# Patient Record
Sex: Female | Born: 1945 | Race: White | Hispanic: No | Marital: Married | Smoking: Never smoker
Health system: Southern US, Community
[De-identification: ages and names within clinical notes are randomized; demographics above are authoritative.]

## PROBLEM LIST (undated history)

## (undated) DIAGNOSIS — H18519 Endothelial corneal dystrophy, unspecified eye: Secondary | ICD-10-CM

## (undated) DIAGNOSIS — K743 Primary biliary cirrhosis: Secondary | ICD-10-CM

## (undated) DIAGNOSIS — M358 Other specified systemic involvement of connective tissue: Secondary | ICD-10-CM

## (undated) DIAGNOSIS — K745 Biliary cirrhosis, unspecified: Secondary | ICD-10-CM

## (undated) DIAGNOSIS — H1851 Endothelial corneal dystrophy: Secondary | ICD-10-CM

## (undated) DIAGNOSIS — IMO0002 Reserved for concepts with insufficient information to code with codable children: Secondary | ICD-10-CM

## (undated) DIAGNOSIS — I7300 Raynaud's syndrome without gangrene: Secondary | ICD-10-CM

## (undated) DIAGNOSIS — Z9889 Other specified postprocedural states: Secondary | ICD-10-CM

## (undated) DIAGNOSIS — Z8719 Personal history of other diseases of the digestive system: Secondary | ICD-10-CM

## (undated) DIAGNOSIS — L940 Localized scleroderma [morphea]: Secondary | ICD-10-CM

## (undated) DIAGNOSIS — M810 Age-related osteoporosis without current pathological fracture: Secondary | ICD-10-CM

## (undated) HISTORY — PX: CHOLECYSTECTOMY: SHX55

## (undated) HISTORY — PX: TUBAL LIGATION: SHX77

## (undated) HISTORY — PX: RETINAL DETACHMENT SURGERY: SHX105

## (undated) HISTORY — DX: Localized scleroderma (morphea): L94.0

## (undated) HISTORY — DX: Reserved for concepts with insufficient information to code with codable children: IMO0002

## (undated) HISTORY — PX: REFRACTIVE SURGERY: SHX103

## (undated) HISTORY — PX: OTHER SURGICAL HISTORY: SHX169

## (undated) HISTORY — PX: CATARACT EXTRACTION: SUR2

## (undated) HISTORY — PX: CORNEAL TRANSPLANT: SHX108

## (undated) HISTORY — DX: Localized scleroderma (morphea): K74.3

## (undated) HISTORY — PX: KNEE SURGERY: SHX244

## (undated) HISTORY — DX: Endothelial corneal dystrophy: H18.51

## (undated) HISTORY — PX: APPENDECTOMY: SHX54

## (undated) HISTORY — DX: Age-related osteoporosis without current pathological fracture: M81.0

## (undated) HISTORY — DX: Endothelial corneal dystrophy, unspecified eye: H18.519

## (undated) HISTORY — DX: Biliary cirrhosis, unspecified: K74.5

## (undated) HISTORY — PX: TONSILLECTOMY: SUR1361

## (undated) HISTORY — PX: COLONOSCOPY: SHX174

## (undated) HISTORY — DX: Other specified systemic involvement of connective tissue: M35.8

---

## 1998-10-08 ENCOUNTER — Other Ambulatory Visit: Admission: RE | Admit: 1998-10-08 | Discharge: 1998-10-08 | Payer: Self-pay | Admitting: Gynecology

## 1999-07-29 ENCOUNTER — Encounter: Admission: RE | Admit: 1999-07-29 | Discharge: 1999-07-29 | Payer: Self-pay | Admitting: Gynecology

## 1999-07-29 ENCOUNTER — Encounter: Payer: Self-pay | Admitting: Gynecology

## 1999-10-22 ENCOUNTER — Other Ambulatory Visit: Admission: RE | Admit: 1999-10-22 | Discharge: 1999-10-22 | Payer: Self-pay | Admitting: Gynecology

## 1999-11-05 ENCOUNTER — Encounter: Admission: RE | Admit: 1999-11-05 | Discharge: 1999-11-05 | Payer: Self-pay | Admitting: Gynecology

## 1999-11-05 ENCOUNTER — Encounter: Payer: Self-pay | Admitting: Gynecology

## 2000-07-29 ENCOUNTER — Encounter: Payer: Self-pay | Admitting: Gynecology

## 2000-07-29 ENCOUNTER — Encounter: Admission: RE | Admit: 2000-07-29 | Discharge: 2000-07-29 | Payer: Self-pay | Admitting: Gynecology

## 2000-12-23 ENCOUNTER — Other Ambulatory Visit: Admission: RE | Admit: 2000-12-23 | Discharge: 2000-12-23 | Payer: Self-pay | Admitting: Gynecology

## 2001-07-31 ENCOUNTER — Encounter: Payer: Self-pay | Admitting: Gynecology

## 2001-07-31 ENCOUNTER — Encounter: Admission: RE | Admit: 2001-07-31 | Discharge: 2001-07-31 | Payer: Self-pay | Admitting: Gynecology

## 2001-12-27 ENCOUNTER — Other Ambulatory Visit: Admission: RE | Admit: 2001-12-27 | Discharge: 2001-12-27 | Payer: Self-pay | Admitting: Gynecology

## 2002-09-04 ENCOUNTER — Encounter: Admission: RE | Admit: 2002-09-04 | Discharge: 2002-09-04 | Payer: Self-pay | Admitting: Gynecology

## 2002-09-04 ENCOUNTER — Encounter: Payer: Self-pay | Admitting: Gynecology

## 2003-01-28 ENCOUNTER — Other Ambulatory Visit: Admission: RE | Admit: 2003-01-28 | Discharge: 2003-01-28 | Payer: Self-pay | Admitting: Gynecology

## 2003-07-15 ENCOUNTER — Encounter: Admission: RE | Admit: 2003-07-15 | Discharge: 2003-07-15 | Payer: Self-pay | Admitting: Internal Medicine

## 2003-09-20 ENCOUNTER — Encounter: Admission: RE | Admit: 2003-09-20 | Discharge: 2003-09-20 | Payer: Self-pay | Admitting: Gynecology

## 2004-01-02 ENCOUNTER — Encounter: Admission: RE | Admit: 2004-01-02 | Discharge: 2004-01-02 | Payer: Self-pay | Admitting: Internal Medicine

## 2004-01-20 ENCOUNTER — Ambulatory Visit (HOSPITAL_COMMUNITY): Admission: RE | Admit: 2004-01-20 | Discharge: 2004-01-20 | Payer: Self-pay | Admitting: Orthopaedic Surgery

## 2006-05-18 ENCOUNTER — Encounter: Admission: RE | Admit: 2006-05-18 | Discharge: 2006-05-18 | Payer: Self-pay | Admitting: Gynecology

## 2006-07-08 ENCOUNTER — Ambulatory Visit (HOSPITAL_COMMUNITY): Admission: RE | Admit: 2006-07-08 | Discharge: 2006-07-08 | Payer: Self-pay | Admitting: Internal Medicine

## 2006-07-20 ENCOUNTER — Ambulatory Visit (HOSPITAL_COMMUNITY): Admission: RE | Admit: 2006-07-20 | Discharge: 2006-07-20 | Payer: Self-pay | Admitting: *Deleted

## 2006-07-20 ENCOUNTER — Encounter (INDEPENDENT_AMBULATORY_CARE_PROVIDER_SITE_OTHER): Payer: Self-pay | Admitting: *Deleted

## 2007-09-05 ENCOUNTER — Encounter: Admission: RE | Admit: 2007-09-05 | Discharge: 2007-09-05 | Payer: Self-pay | Admitting: Gynecology

## 2011-09-09 ENCOUNTER — Other Ambulatory Visit: Payer: Self-pay | Admitting: Gynecology

## 2011-09-09 DIAGNOSIS — Z1231 Encounter for screening mammogram for malignant neoplasm of breast: Secondary | ICD-10-CM

## 2011-10-05 ENCOUNTER — Ambulatory Visit
Admission: RE | Admit: 2011-10-05 | Discharge: 2011-10-05 | Disposition: A | Discharge status: Home / Self Care | Payer: Medicare Other | Source: Ambulatory Visit | Attending: Gynecology | Admitting: Gynecology

## 2011-10-05 DIAGNOSIS — Z1231 Encounter for screening mammogram for malignant neoplasm of breast: Secondary | ICD-10-CM

## 2011-10-19 ENCOUNTER — Other Ambulatory Visit: Payer: Self-pay | Admitting: Gynecology

## 2011-12-23 ENCOUNTER — Other Ambulatory Visit: Payer: Self-pay | Admitting: Gastroenterology

## 2011-12-23 DIAGNOSIS — R634 Abnormal weight loss: Secondary | ICD-10-CM

## 2011-12-23 DIAGNOSIS — R109 Unspecified abdominal pain: Secondary | ICD-10-CM

## 2011-12-28 ENCOUNTER — Ambulatory Visit
Admission: RE | Admit: 2011-12-28 | Discharge: 2011-12-28 | Disposition: A | Discharge status: Home / Self Care | Payer: Medicare Other | Source: Ambulatory Visit | Attending: Gastroenterology | Admitting: Gastroenterology

## 2011-12-28 DIAGNOSIS — R109 Unspecified abdominal pain: Secondary | ICD-10-CM

## 2011-12-28 DIAGNOSIS — R634 Abnormal weight loss: Secondary | ICD-10-CM

## 2011-12-28 MED ORDER — IOHEXOL 300 MG/ML  SOLN
100.0000 mL | Freq: Once | INTRAMUSCULAR | Status: AC | PRN
  Administered 2011-12-28: 100 mL via INTRAVENOUS

## 2012-01-13 ENCOUNTER — Other Ambulatory Visit: Payer: Self-pay | Admitting: Gastroenterology

## 2012-08-22 ENCOUNTER — Other Ambulatory Visit: Payer: Self-pay | Admitting: Registered Nurse

## 2012-09-20 ENCOUNTER — Ambulatory Visit: Payer: Medicare Other | Admitting: Sports Medicine

## 2013-09-07 ENCOUNTER — Other Ambulatory Visit: Payer: Self-pay | Admitting: Dermatology

## 2014-03-13 ENCOUNTER — Other Ambulatory Visit: Payer: Self-pay

## 2014-03-13 DIAGNOSIS — Z1231 Encounter for screening mammogram for malignant neoplasm of breast: Secondary | ICD-10-CM

## 2014-04-02 ENCOUNTER — Ambulatory Visit
Admission: RE | Admit: 2014-04-02 | Discharge: 2014-04-02 | Disposition: A | Discharge status: Home / Self Care | Payer: Medicare Other | Source: Ambulatory Visit

## 2014-04-02 DIAGNOSIS — Z1231 Encounter for screening mammogram for malignant neoplasm of breast: Secondary | ICD-10-CM

## 2014-04-03 ENCOUNTER — Other Ambulatory Visit: Payer: Self-pay | Admitting: Internal Medicine

## 2014-04-03 DIAGNOSIS — R928 Other abnormal and inconclusive findings on diagnostic imaging of breast: Secondary | ICD-10-CM

## 2014-04-18 ENCOUNTER — Ambulatory Visit
Admission: RE | Admit: 2014-04-18 | Discharge: 2014-04-18 | Disposition: A | Discharge status: Home / Self Care | Payer: Medicare Other | Source: Ambulatory Visit | Attending: Internal Medicine | Admitting: Internal Medicine

## 2014-04-18 DIAGNOSIS — R928 Other abnormal and inconclusive findings on diagnostic imaging of breast: Secondary | ICD-10-CM

## 2014-10-24 ENCOUNTER — Other Ambulatory Visit: Payer: Self-pay | Admitting: Gastroenterology

## 2014-10-24 DIAGNOSIS — K743 Primary biliary cirrhosis: Secondary | ICD-10-CM

## 2014-10-31 ENCOUNTER — Ambulatory Visit
Admission: RE | Admit: 2014-10-31 | Discharge: 2014-10-31 | Disposition: A | Discharge status: Home / Self Care | Payer: PPO | Source: Ambulatory Visit | Attending: Gastroenterology | Admitting: Gastroenterology

## 2014-10-31 DIAGNOSIS — K743 Primary biliary cirrhosis: Secondary | ICD-10-CM

## 2015-07-09 DIAGNOSIS — Z961 Presence of intraocular lens: Secondary | ICD-10-CM | POA: Diagnosis not present

## 2015-07-09 DIAGNOSIS — H1851 Endothelial corneal dystrophy: Secondary | ICD-10-CM | POA: Diagnosis not present

## 2015-07-09 DIAGNOSIS — R51 Headache: Secondary | ICD-10-CM | POA: Diagnosis not present

## 2015-07-09 DIAGNOSIS — Z947 Corneal transplant status: Secondary | ICD-10-CM | POA: Diagnosis not present

## 2015-08-27 DIAGNOSIS — Z961 Presence of intraocular lens: Secondary | ICD-10-CM | POA: Diagnosis not present

## 2015-08-27 DIAGNOSIS — Z947 Corneal transplant status: Secondary | ICD-10-CM | POA: Diagnosis not present

## 2015-08-27 DIAGNOSIS — H1851 Endothelial corneal dystrophy: Secondary | ICD-10-CM | POA: Diagnosis not present

## 2015-08-27 DIAGNOSIS — H3509 Other intraretinal microvascular abnormalities: Secondary | ICD-10-CM | POA: Diagnosis not present

## 2015-09-25 DIAGNOSIS — IMO0002 Reserved for concepts with insufficient information to code with codable children: Secondary | ICD-10-CM

## 2015-09-25 HISTORY — DX: Reserved for concepts with insufficient information to code with codable children: IMO0002

## 2015-10-03 ENCOUNTER — Encounter: Payer: Self-pay | Admitting: Gynecology

## 2015-10-03 ENCOUNTER — Ambulatory Visit (INDEPENDENT_AMBULATORY_CARE_PROVIDER_SITE_OTHER): Payer: PPO | Admitting: Gynecology

## 2015-10-03 VITALS — BP 120/76 | Ht 64.0 in | Wt 133.0 lb

## 2015-10-03 DIAGNOSIS — N814 Uterovaginal prolapse, unspecified: Secondary | ICD-10-CM | POA: Diagnosis not present

## 2015-10-03 DIAGNOSIS — M810 Age-related osteoporosis without current pathological fracture: Secondary | ICD-10-CM

## 2015-10-03 DIAGNOSIS — N816 Rectocele: Secondary | ICD-10-CM | POA: Diagnosis not present

## 2015-10-03 DIAGNOSIS — N8111 Cystocele, midline: Secondary | ICD-10-CM | POA: Diagnosis not present

## 2015-10-03 DIAGNOSIS — Z01419 Encounter for gynecological examination (general) (routine) without abnormal findings: Secondary | ICD-10-CM | POA: Diagnosis not present

## 2015-10-03 DIAGNOSIS — N952 Postmenopausal atrophic vaginitis: Secondary | ICD-10-CM | POA: Diagnosis not present

## 2015-10-03 DIAGNOSIS — R87610 Atypical squamous cells of undetermined significance on cytologic smear of cervix (ASC-US): Secondary | ICD-10-CM | POA: Diagnosis not present

## 2015-10-03 NOTE — Addendum Note (Signed)
Addended by: Nelva Nay on: 10/03/2015 03:47 PM   Modules accepted: Orders

## 2015-10-03 NOTE — Patient Instructions (Signed)
Office will call you to arrange urology appointment.  Talk to Dr. Shelia Media in reference to starting Prolia  You may obtain a copy of any labs that were done today by logging onto MyChart as outlined in the instructions provided with your AVS (after visit summary). The office will not call with normal lab results but certainly if there are any significant abnormalities then we will contact you.   Health Maintenance Adopting a healthy lifestyle and getting preventive care can go a long way to promote health and wellness. Talk with your health care provider about what schedule of regular examinations is right for you. This is a good chance for you to check in with your provider about disease prevention and staying healthy. In between checkups, there are plenty of things you can do on your own. Experts have done a lot of research about which lifestyle changes and preventive measures are most likely to keep you healthy. Ask your health care provider for more information. WEIGHT AND DIET  Eat a healthy diet  Be sure to include plenty of vegetables, fruits, low-fat dairy products, and lean protein.  Do not eat a lot of foods high in solid fats, added sugars, or salt.  Get regular exercise. This is one of the most important things you can do for your health.  Most adults should exercise for at least 150 minutes each week. The exercise should increase your heart rate and make you sweat (moderate-intensity exercise).  Most adults should also do strengthening exercises at least twice a week. This is in addition to the moderate-intensity exercise.  Maintain a healthy weight  Body mass index (BMI) is a measurement that can be used to identify possible weight problems. It estimates body fat based on height and weight. Your health care provider can help determine your BMI and help you achieve or maintain a healthy weight.  For females 40 years of age and older:   A BMI below 18.5 is considered  underweight.  A BMI of 18.5 to 24.9 is normal.  A BMI of 25 to 29.9 is considered overweight.  A BMI of 30 and above is considered obese.  Watch levels of cholesterol and blood lipids  You should start having your blood tested for lipids and cholesterol at 69 years of age, then have this test every 5 years.  You may need to have your cholesterol levels checked more often if:  Your lipid or cholesterol levels are high.  You are older than 69 years of age.  You are at high risk for heart disease.  CANCER SCREENING   Lung Cancer  Lung cancer screening is recommended for adults 48-32 years old who are at high risk for lung cancer because of a history of smoking.  A yearly low-dose CT scan of the lungs is recommended for people who:  Currently smoke.  Have quit within the past 15 years.  Have at least a 30-pack-year history of smoking. A pack year is smoking an average of one pack of cigarettes a day for 1 year.  Yearly screening should continue until it has been 15 years since you quit.  Yearly screening should stop if you develop a health problem that would prevent you from having lung cancer treatment.  Breast Cancer  Practice breast self-awareness. This means understanding how your breasts normally appear and feel.  It also means doing regular breast self-exams. Let your health care provider know about any changes, no matter how small.  If you are in  your 20s or 30s, you should have a clinical breast exam (CBE) by a health care provider every 1-3 years as part of a regular health exam.  If you are 39 or older, have a CBE every year. Also consider having a breast X-ray (mammogram) every year.  If you have a family history of breast cancer, talk to your health care provider about genetic screening.  If you are at high risk for breast cancer, talk to your health care provider about having an MRI and a mammogram every year.  Breast cancer gene (BRCA) assessment is  recommended for women who have family members with BRCA-related cancers. BRCA-related cancers include:  Breast.  Ovarian.  Tubal.  Peritoneal cancers.  Results of the assessment will determine the need for genetic counseling and BRCA1 and BRCA2 testing. Cervical Cancer Routine pelvic examinations to screen for cervical cancer are no longer recommended for nonpregnant women who are considered low risk for cancer of the pelvic organs (ovaries, uterus, and vagina) and who do not have symptoms. A pelvic examination may be necessary if you have symptoms including those associated with pelvic infections. Ask your health care provider if a screening pelvic exam is right for you.   The Pap test is the screening test for cervical cancer for women who are considered at risk.  If you had a hysterectomy for a problem that was not cancer or a condition that could lead to cancer, then you no longer need Pap tests.  If you are older than 65 years, and you have had normal Pap tests for the past 10 years, you no longer need to have Pap tests.  If you have had past treatment for cervical cancer or a condition that could lead to cancer, you need Pap tests and screening for cancer for at least 20 years after your treatment.  If you no longer get a Pap test, assess your risk factors if they change (such as having a new sexual partner). This can affect whether you should start being screened again.  Some women have medical problems that increase their chance of getting cervical cancer. If this is the case for you, your health care provider may recommend more frequent screening and Pap tests.  The human papillomavirus (HPV) test is another test that may be used for cervical cancer screening. The HPV test looks for the virus that can cause cell changes in the cervix. The cells collected during the Pap test can be tested for HPV.  The HPV test can be used to screen women 60 years of age and older. Getting tested  for HPV can extend the interval between normal Pap tests from three to five years.  An HPV test also should be used to screen women of any age who have unclear Pap test results.  After 69 years of age, women should have HPV testing as often as Pap tests.  Colorectal Cancer  This type of cancer can be detected and often prevented.  Routine colorectal cancer screening usually begins at 69 years of age and continues through 69 years of age.  Your health care provider may recommend screening at an earlier age if you have risk factors for colon cancer.  Your health care provider may also recommend using home test kits to check for hidden blood in the stool.  A small camera at the end of a tube can be used to examine your colon directly (sigmoidoscopy or colonoscopy). This is done to check for the earliest forms of  colorectal cancer.  Routine screening usually begins at age 66.  Direct examination of the colon should be repeated every 5-10 years through 69 years of age. However, you may need to be screened more often if early forms of precancerous polyps or small growths are found. Skin Cancer  Check your skin from head to toe regularly.  Tell your health care provider about any new moles or changes in moles, especially if there is a change in a mole's shape or color.  Also tell your health care provider if you have a mole that is larger than the size of a pencil eraser.  Always use sunscreen. Apply sunscreen liberally and repeatedly throughout the day.  Protect yourself by wearing long sleeves, pants, a wide-brimmed hat, and sunglasses whenever you are outside. HEART DISEASE, DIABETES, AND HIGH BLOOD PRESSURE   Have your blood pressure checked at least every 1-2 years. High blood pressure causes heart disease and increases the risk of stroke.  If you are between 55 years and 55 years old, ask your health care provider if you should take aspirin to prevent strokes.  Have regular  diabetes screenings. This involves taking a blood sample to check your fasting blood sugar level.  If you are at a normal weight and have a low risk for diabetes, have this test once every three years after 69 years of age.  If you are overweight and have a high risk for diabetes, consider being tested at a younger age or more often. PREVENTING INFECTION  Hepatitis B  If you have a higher risk for hepatitis B, you should be screened for this virus. You are considered at high risk for hepatitis B if:  You were born in a country where hepatitis B is common. Ask your health care provider which countries are considered high risk.  Your parents were born in a high-risk country, and you have not been immunized against hepatitis B (hepatitis B vaccine).  You have HIV or AIDS.  You use needles to inject street drugs.  You live with someone who has hepatitis B.  You have had sex with someone who has hepatitis B.  You get hemodialysis treatment.  You take certain medicines for conditions, including cancer, organ transplantation, and autoimmune conditions. Hepatitis C  Blood testing is recommended for:  Everyone born from [birth year] through 1964.  Anyone with known risk factors for hepatitis C. Sexually transmitted infections (STIs)  You should be screened for sexually transmitted infections (STIs) including gonorrhea and chlamydia if:  You are sexually active and are younger than 69 years of age.  You are older than 69 years of age and your health care provider tells you that you are at risk for this type of infection.  Your sexual activity has changed since you were last screened and you are at an increased risk for chlamydia or gonorrhea. Ask your health care provider if you are at risk.  If you do not have HIV, but are at risk, it may be recommended that you take a prescription medicine daily to prevent HIV infection. This is called pre-exposure prophylaxis (PrEP). You are considered at  risk if:  You are sexually active and do not regularly use condoms or know the HIV status of your partner(s).  You take drugs by injection.  You are sexually active with a partner who has HIV. Talk with your health care provider about whether you are at high risk of being infected with HIV. If you choose to begin PrEP,  you should first be tested for HIV. You should then be tested every 3 months for as long as you are taking PrEP.  PREGNANCY   If you are premenopausal and you may become pregnant, ask your health care provider about preconception counseling.  If you may become pregnant, take 400 to 800 micrograms (mcg) of folic acid every day.  If you want to prevent pregnancy, talk to your health care provider about birth control (contraception). OSTEOPOROSIS AND MENOPAUSE   Osteoporosis is a disease in which the bones lose minerals and strength with aging. This can result in serious bone fractures. Your risk for osteoporosis can be identified using a bone density scan.  If you are 79 years of age or older, or if you are at risk for osteoporosis and fractures, ask your health care provider if you should be screened.  Ask your health care provider whether you should take a calcium or vitamin D supplement to lower your risk for osteoporosis.  Menopause may have certain physical symptoms and risks.  Hormone replacement therapy may reduce some of these symptoms and risks. Talk to your health care provider about whether hormone replacement therapy is right for you.  HOME CARE INSTRUCTIONS   Schedule regular health, dental, and eye exams.  Stay current with your immunizations.   Do not use any tobacco products including cigarettes, chewing tobacco, or electronic cigarettes.  If you are pregnant, do not drink alcohol.  If you are breastfeeding, limit how much and how often you drink alcohol.  Limit alcohol intake to no more than 1 drink per day for nonpregnant women. One drink equals  12 ounces of beer, 5 ounces of wine, or 1 ounces of hard liquor.  Do not use street drugs.  Do not share needles.  Ask your health care provider for help if you need support or information about quitting drugs.  Tell your health care provider if you often feel depressed.  Tell your health care provider if you have ever been abused or do not feel safe at home. Document Released: 10/26/2010 Document Revised: 08/27/2013 Document Reviewed: 03/14/2013 [facility] Patient Information 2015 [place], [place]. This information is not intended to replace advice given to you by your health care provider. Make sure you discuss any questions you have with your health care provider.

## 2015-10-03 NOTE — Progress Notes (Addendum)
Sandra Hobbs 22-Dec-1945 [id number]        70 y.o.  G2P2  for breast and pelvic exam.  Former patient of Dr. Ubaldo Glassing. Has not been seen in several years. Patient notes a bulging from her vagina over the past several years with straining. Is not having significant incontinence or pelvic discomfort.  Past medical history,surgical history, problem list, medications, allergies, family history and social history were all reviewed and documented as reviewed in the EPIC chart.  ROS:  Performed with pertinent positives and negatives included in the history, assessment and plan.   Additional significant findings :  None   Exam: [name] assistant Filed Vitals:   10/03/15 1429  BP: 120/76  Height: 5\' 4"  (1.626 m)  Weight: 133 lb (60.328 kg)   General appearance:  Normal affect, orientation and appearance. Skin: Grossly normal HEENT: Without gross lesions.  No cervical or supraclavicular adenopathy. Thyroid normal.  Lungs:  Clear without wheezing, rales or rhonchi Cardiac: RR, without RMG Abdominal:  Soft, nontender, without masses, guarding, rebound, organomegaly or hernia Breasts:  Examined lying and sitting without masses, retractions, discharge or axillary adenopathy. Pelvic:  Ext/BUS/vagina With second-degree cystocele and second-degree uterine prolapse. Mild rectocele. Generalized atrophic changes.  Cervix with atrophic changes. Pap smear done  Uterus anteverted, normal size, shape and contour, midline and mobile nontender   Adnexa without masses or tenderness    Anus and perineum normal   Rectovaginal normal sphincter tone without palpated masses or tenderness.    Assessment/Plan:  70 y.o. G2P2 female for breast and pelvic exam.   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 She also has significant uterine prolapse. A mild rectocele noted. I reviewed options with her to include observation, pessary and surgery. Is not having  significant incontinence historically. Surgery to include cystocele repair TVH BSO with or without rectocele repair was discussed. I'm going to refer her to urology for their evaluation and consideration of the cystocele repair as a combined procedure with myself performing the hysterectomy again with or without rectocele repair. Patient agrees with this. She's not sure she is ready to proceed with surgery at this point but would like at least to be evaluated. 2. Pap smear 2013. Pap smear done today. No history of significant abnormal Pap smears previously. 3. Mammography 2015. Patient acknowledges she is overdue and agrees to call and schedule. SBE monthly reviewed. 4. Colonoscopy 2015. Has appointment to see Dr. Sarina Ser being followed for biliary cirrhosis. 5. Osteoporosis. DEXA 2013 T score -3.9. This was done by Dr. Ubaldo Glassing. Apparently had transiently tried Fosamax/Boniva for several months but did not tolerate. Most recently had a discussion with Dr. Shelia Media as she had a bone density 2016 but I do not have a copy of this. She was to start Prolia and apparently had gone through the precertification process but never followed up for the injection. I reviewed with her the risks of fracture and my recommendations that she further discuss with Dr. Shelia Media and consider some treatment and she agrees to do so. 6. Health maintenance. No routine lab work done as she reports this done elsewhere. Patient will see the urologist and follow up with me if she is interested in pursuing surgery. At this point she is not interested in pessary.  Greater than 15 minutes of my time in excess of her breast and pelvic exam was spent in direct face to face counseling and coordination of care in regards to her  problems of pelvic relaxation and osteoporosis . [name] MD, 3:32 PM 10/03/2015

## 2015-10-06 ENCOUNTER — Telehealth: Payer: Self-pay | Admitting: *Deleted

## 2015-10-06 NOTE — Addendum Note (Signed)
Addended by: [name] on: 10/06/2015 02:03 PM   Modules accepted: Level of Service

## 2015-10-06 NOTE — Telephone Encounter (Signed)
Referral and notes faxed to [facility] they will fax me back with time and date to relay to pt.

## 2015-10-06 NOTE — Telephone Encounter (Signed)
-----   Message from [name], MD sent at 10/03/2015  3:40 PM EDT ----- Arrange appointment with Dr. Matilde Sprang at [facility] urology reference patient with cystocele/uterine prolapse/rectocele. Considering hysterectomy. Evaluate for cystocele repair as combined procedure

## 2015-10-08 LAB — PAP IG W/ RFLX HPV ASCU

## 2015-10-09 LAB — HUMAN PAPILLOMAVIRUS, HIGH RISK: HPV DNA High Risk: NOT DETECTED

## 2015-10-10 ENCOUNTER — Encounter: Payer: Self-pay | Admitting: Gynecology

## 2015-10-10 NOTE — Telephone Encounter (Signed)
Appointment on 11/21/15 @ 1:30pm with Dr.MacDiarmid pt informed

## 2015-11-20 ENCOUNTER — Other Ambulatory Visit: Payer: Self-pay | Admitting: Gastroenterology

## 2015-11-20 DIAGNOSIS — E039 Hypothyroidism, unspecified: Secondary | ICD-10-CM | POA: Diagnosis not present

## 2015-11-20 DIAGNOSIS — A09 Infectious gastroenteritis and colitis, unspecified: Secondary | ICD-10-CM | POA: Diagnosis not present

## 2015-11-20 DIAGNOSIS — K743 Primary biliary cirrhosis: Secondary | ICD-10-CM

## 2015-11-20 DIAGNOSIS — M810 Age-related osteoporosis without current pathological fracture: Secondary | ICD-10-CM | POA: Diagnosis not present

## 2015-11-21 DIAGNOSIS — R350 Frequency of micturition: Secondary | ICD-10-CM | POA: Diagnosis not present

## 2015-11-21 DIAGNOSIS — N8111 Cystocele, midline: Secondary | ICD-10-CM | POA: Diagnosis not present

## 2015-11-21 DIAGNOSIS — N3941 Urge incontinence: Secondary | ICD-10-CM | POA: Diagnosis not present

## 2015-11-28 ENCOUNTER — Ambulatory Visit
Admission: RE | Admit: 2015-11-28 | Discharge: 2015-11-28 | Disposition: A | Discharge status: Home / Self Care | Payer: PPO | Source: Ambulatory Visit | Attending: Gastroenterology | Admitting: Gastroenterology

## 2015-11-28 DIAGNOSIS — K743 Primary biliary cirrhosis: Secondary | ICD-10-CM

## 2015-11-28 DIAGNOSIS — K7460 Unspecified cirrhosis of liver: Secondary | ICD-10-CM | POA: Diagnosis not present

## 2016-01-29 DIAGNOSIS — M810 Age-related osteoporosis without current pathological fracture: Secondary | ICD-10-CM | POA: Diagnosis not present

## 2016-01-29 DIAGNOSIS — Z0000 Encounter for general adult medical examination without abnormal findings: Secondary | ICD-10-CM | POA: Diagnosis not present

## 2016-01-29 DIAGNOSIS — E559 Vitamin D deficiency, unspecified: Secondary | ICD-10-CM | POA: Diagnosis not present

## 2016-01-29 DIAGNOSIS — N390 Urinary tract infection, site not specified: Secondary | ICD-10-CM | POA: Diagnosis not present

## 2016-02-04 DIAGNOSIS — Z0000 Encounter for general adult medical examination without abnormal findings: Secondary | ICD-10-CM | POA: Diagnosis not present

## 2016-02-04 DIAGNOSIS — N390 Urinary tract infection, site not specified: Secondary | ICD-10-CM | POA: Diagnosis not present

## 2016-02-04 DIAGNOSIS — Z23 Encounter for immunization: Secondary | ICD-10-CM | POA: Diagnosis not present

## 2016-02-04 DIAGNOSIS — K745 Biliary cirrhosis, unspecified: Secondary | ICD-10-CM | POA: Diagnosis not present

## 2016-02-05 ENCOUNTER — Encounter: Payer: Self-pay | Admitting: Gynecology

## 2016-02-06 NOTE — Telephone Encounter (Signed)
I spoke with her about seeing urology to correct the cystocele surgically at the time of vaginal hysterectomy.  If interested recommend urology consult ie Dr Bernerd Limbo

## 2016-02-08 NOTE — Telephone Encounter (Signed)
Hysterectomy alone unlikely to correct the bladder prolapse, but I will review a copy of his office visit and see what he recommended.  Please obtain a copy for me to review

## 2016-02-09 NOTE — Telephone Encounter (Signed)
Dr. Mikle Bosworth office note is in her chart. It is scanned in in [facility] practitioner under "office visit [facility]".

## 2016-02-09 NOTE — Telephone Encounter (Signed)
I spoke with Dr. MCDiarmid's office. She has not had the urodynamics. They spoke with her on 01/27/16 and it appears to be financial reasons and health why she has not scheduled. No appt currently scheduled.

## 2016-02-09 NOTE — Telephone Encounter (Signed)
Did she have the urodynamics as he had ordered?

## 2016-05-06 DIAGNOSIS — R300 Dysuria: Secondary | ICD-10-CM | POA: Diagnosis not present

## 2016-05-06 DIAGNOSIS — N390 Urinary tract infection, site not specified: Secondary | ICD-10-CM | POA: Diagnosis not present

## 2016-07-01 DIAGNOSIS — R350 Frequency of micturition: Secondary | ICD-10-CM | POA: Diagnosis not present

## 2016-07-01 DIAGNOSIS — R8271 Bacteriuria: Secondary | ICD-10-CM | POA: Diagnosis not present

## 2016-07-13 DIAGNOSIS — N3941 Urge incontinence: Secondary | ICD-10-CM | POA: Diagnosis not present

## 2016-07-13 DIAGNOSIS — R350 Frequency of micturition: Secondary | ICD-10-CM | POA: Diagnosis not present

## 2016-07-16 DIAGNOSIS — N8111 Cystocele, midline: Secondary | ICD-10-CM | POA: Diagnosis not present

## 2016-07-16 DIAGNOSIS — N3941 Urge incontinence: Secondary | ICD-10-CM | POA: Diagnosis not present

## 2016-08-04 ENCOUNTER — Telehealth: Payer: Self-pay

## 2016-08-04 NOTE — Telephone Encounter (Signed)
Patient recently seen my Dr. McDiarmid and needs Cystocele repair, vault suspension and graft. Patient is ready to schedule surgery and Pam was calling to coordinate surgery.  Patient is due for CE in June but it will likely be July before we can even coordinate the case.  Do you want to go ahead and give me surgery slip order or would you like to see her first for exam?

## 2016-08-04 NOTE — Telephone Encounter (Signed)
Surgery slip sent 

## 2016-08-05 ENCOUNTER — Telehealth: Payer: Self-pay

## 2016-08-05 NOTE — Telephone Encounter (Signed)
I called patient because urology scheduler contacted me to coordinate case. Patient is fine with 11/09/16 date we can coordinate for her. 7:30am at [facility].  Pre op appt was scheduled.

## 2016-08-06 ENCOUNTER — Other Ambulatory Visit: Payer: Self-pay | Admitting: Urology

## 2016-08-06 DIAGNOSIS — R197 Diarrhea, unspecified: Secondary | ICD-10-CM | POA: Diagnosis not present

## 2016-08-06 DIAGNOSIS — B029 Zoster without complications: Secondary | ICD-10-CM | POA: Diagnosis not present

## 2016-08-16 DIAGNOSIS — Z947 Corneal transplant status: Secondary | ICD-10-CM | POA: Diagnosis not present

## 2016-08-16 DIAGNOSIS — H04123 Dry eye syndrome of bilateral lacrimal glands: Secondary | ICD-10-CM | POA: Diagnosis not present

## 2016-08-16 DIAGNOSIS — H1851 Endothelial corneal dystrophy: Secondary | ICD-10-CM | POA: Diagnosis not present

## 2016-08-16 DIAGNOSIS — Z961 Presence of intraocular lens: Secondary | ICD-10-CM | POA: Diagnosis not present

## 2016-08-18 DIAGNOSIS — R197 Diarrhea, unspecified: Secondary | ICD-10-CM | POA: Diagnosis not present

## 2016-10-05 ENCOUNTER — Ambulatory Visit (INDEPENDENT_AMBULATORY_CARE_PROVIDER_SITE_OTHER): Payer: PPO | Admitting: Gynecology

## 2016-10-05 ENCOUNTER — Encounter: Payer: Self-pay | Admitting: Gynecology

## 2016-10-05 ENCOUNTER — Other Ambulatory Visit: Payer: Self-pay | Admitting: Gynecology

## 2016-10-05 VITALS — BP 124/80 | Ht 63.0 in | Wt 124.0 lb

## 2016-10-05 DIAGNOSIS — K432 Incisional hernia without obstruction or gangrene: Secondary | ICD-10-CM

## 2016-10-05 DIAGNOSIS — Z124 Encounter for screening for malignant neoplasm of cervix: Secondary | ICD-10-CM

## 2016-10-05 DIAGNOSIS — N8189 Other female genital prolapse: Secondary | ICD-10-CM | POA: Diagnosis not present

## 2016-10-05 DIAGNOSIS — Z01411 Encounter for gynecological examination (general) (routine) with abnormal findings: Secondary | ICD-10-CM

## 2016-10-05 DIAGNOSIS — M810 Age-related osteoporosis without current pathological fracture: Secondary | ICD-10-CM

## 2016-10-05 DIAGNOSIS — N72 Inflammatory disease of cervix uteri: Secondary | ICD-10-CM | POA: Diagnosis not present

## 2016-10-05 DIAGNOSIS — N952 Postmenopausal atrophic vaginitis: Secondary | ICD-10-CM

## 2016-10-05 NOTE — Patient Instructions (Signed)
Schedule your mammogram  Follow up for your surgery preop appointment as scheduled

## 2016-10-05 NOTE — Progress Notes (Addendum)
    Sandra Hobbs [birth year] [id number]        71 y.o.  G2P2 for breast and pelvic exam.  Patient scheduled for upcoming TVH BSO cystocele repair with Dr. Bernerd Limbo.   Past medical history,surgical history, problem list, medications, allergies, family history and social history were all reviewed and documented as reviewed in the EPIC chart.  ROS:  Performed with pertinent positives and negatives included in the history, assessment and plan.   Additional significant findings :  None   Exam: Sandra Hobbs assistant Vitals:   10/05/16 1403  BP: 124/80  Weight: 124 lb (56.2 kg)  Height: 5\' 3"  (1.6 m)   Body mass index is 21.97 kg/m.  General appearance:  Normal affect, orientation and appearance. Skin: Grossly normal HEENT: Without gross lesions.  No cervical or supraclavicular adenopathy. Thyroid normal.  Lungs:  Clear without wheezing, rales or rhonchi Cardiac: RR, without RMG Abdominal:  Soft, nontender, without masses, guarding, rebound, organomegaly. 4 cm incisional hernia at her gallbladder scar site. Breasts:  Examined lying and sitting without masses, retractions, discharge or axillary adenopathy. Pelvic:  Ext, BUS, Vagina: With atrophic changes. Second-degree to third-degree cystocele. Second-degree uterine prolapse. Mild rectocele  Cervix: Atrophic changes. Pap smear done  Uterus: Anteverted, normal size, shape and contour, midline and mobile nontender   Adnexa: Without masses or tenderness    Anus and perineum: Normal   Rectovaginal: Normal sphincter tone without palpated masses or tenderness.    Assessment/Plan:  71 y.o. G2P2 female for breast and pelvic exam.   1. Pelvic relaxation with scheduled TVH BSO cystocele repair in July. Patient will follow up for full preoperative consult before hand. Check urinalysis today given cystocele. 2. Incisional hernia at her gallbladder incision site. Patient is going to call make appointment to see a general surgeon in reference  to repair and she will plan on proceeding with this after her hysterectomy heals. 3. Pap smear/HPV 2017 showed ASCUS with negative high-risk HPV. Pap smear done today. No history of abnormal Pap smears previously. 4. Mammography 2015. Patient knows she is overdue and agrees to call and schedule. SBE monthly reviewed. Breast exam normal today. 5. Osteoporosis. Follow to Dr. Pennie Banter office. Reports DEXA 2016 that I do not have copies of. Is to start on Prolia but has not made that arrangement yet. I encouraged her to follow up with him in reference to this. She is a classic patient at risk for osteoporosis fractures given her small frame and she agrees to follow up with him. 6. Colonoscopy 2015. Repeat at their recommended interval. 7. Health maintenance. No routine lab work done as patient reports is done elsewhere. Follow up for surgery as scheduled. Follow up for annual exam in one year.   [name] MD, 2:28 PM 10/05/2016

## 2016-10-05 NOTE — Addendum Note (Signed)
Addended by: Nelva Nay on: 10/05/2016 02:45 PM   Modules accepted: Orders

## 2016-10-06 ENCOUNTER — Other Ambulatory Visit: Payer: Self-pay | Admitting: Gynecology

## 2016-10-06 DIAGNOSIS — Z1231 Encounter for screening mammogram for malignant neoplasm of breast: Secondary | ICD-10-CM

## 2016-10-06 LAB — URINALYSIS W MICROSCOPIC + REFLEX CULTURE
BILIRUBIN URINE: NEGATIVE
Casts: NONE SEEN [LPF]
Crystals: NONE SEEN [HPF]
GLUCOSE, UA: NEGATIVE
Ketones, ur: NEGATIVE
NITRITE: POSITIVE — AB
PH: 6.5 (ref 5.0–8.0)
SPECIFIC GRAVITY, URINE: 1.013 (ref 1.001–1.035)
YEAST: NONE SEEN [HPF]

## 2016-10-07 ENCOUNTER — Telehealth: Payer: Self-pay

## 2016-10-07 NOTE — Telephone Encounter (Signed)
I called patient and left message to call me as soon as possible regarding her surgery date. (I will need to reschedule it due to Dr. Loetta Rough being out.)

## 2016-10-08 LAB — URINE CULTURE

## 2016-10-08 NOTE — Telephone Encounter (Signed)
Patient called. I informed her about Dr. Dorette Grate absence and need to reschedule her surgery. We moved her surgery to first available coordinating with Dr. Wendy Poet and that is Aug 28 11:00am.  We moved her pre op visit with Dr. Loetta Rough up to a week before that.  I offered apologies to patient for this inconvenience and having to delay her surgery.

## 2016-10-11 ENCOUNTER — Other Ambulatory Visit: Payer: Self-pay | Admitting: *Deleted

## 2016-10-11 LAB — PAP IG W/ RFLX HPV ASCU

## 2016-10-11 MED ORDER — SULFAMETHOXAZOLE-TRIMETHOPRIM 800-160 MG PO TABS: 1.0000 | ORAL_TABLET | Freq: Two times a day (BID) | ORAL | 0 refills | Status: DC

## 2016-10-18 ENCOUNTER — Ambulatory Visit
Admission: RE | Admit: 2016-10-18 | Discharge: 2016-10-18 | Disposition: A | Discharge status: Home / Self Care | Payer: PPO | Source: Ambulatory Visit | Attending: Gynecology | Admitting: Gynecology

## 2016-10-18 DIAGNOSIS — Z1231 Encounter for screening mammogram for malignant neoplasm of breast: Secondary | ICD-10-CM

## 2016-11-01 ENCOUNTER — Ambulatory Visit: Payer: PPO | Admitting: Gynecology

## 2016-11-10 DIAGNOSIS — K745 Biliary cirrhosis, unspecified: Secondary | ICD-10-CM | POA: Diagnosis not present

## 2016-11-10 DIAGNOSIS — A09 Infectious gastroenteritis and colitis, unspecified: Secondary | ICD-10-CM | POA: Diagnosis not present

## 2016-11-10 DIAGNOSIS — Z86010 Personal history of colonic polyps: Secondary | ICD-10-CM | POA: Diagnosis not present

## 2016-11-12 ENCOUNTER — Other Ambulatory Visit (HOSPITAL_COMMUNITY): Payer: Self-pay | Admitting: Gastroenterology

## 2016-11-12 ENCOUNTER — Other Ambulatory Visit: Payer: Self-pay | Admitting: Gastroenterology

## 2016-11-12 DIAGNOSIS — K745 Biliary cirrhosis, unspecified: Secondary | ICD-10-CM

## 2016-11-12 DIAGNOSIS — K743 Primary biliary cirrhosis: Secondary | ICD-10-CM

## 2016-11-17 ENCOUNTER — Ambulatory Visit
Admission: RE | Admit: 2016-11-17 | Discharge: 2016-11-17 | Disposition: A | Discharge status: Home / Self Care | Payer: PPO | Source: Ambulatory Visit | Attending: Gastroenterology | Admitting: Gastroenterology

## 2016-11-17 DIAGNOSIS — K745 Biliary cirrhosis, unspecified: Secondary | ICD-10-CM

## 2016-11-17 DIAGNOSIS — K743 Primary biliary cirrhosis: Secondary | ICD-10-CM

## 2016-11-17 MED ORDER — IOPAMIDOL (ISOVUE-300) INJECTION 61%
100.0000 mL | Freq: Once | INTRAVENOUS | Status: AC | PRN
  Administered 2016-11-17: 100 mL via INTRAVENOUS

## 2016-12-11 ENCOUNTER — Encounter: Payer: Self-pay | Admitting: Obstetrics & Gynecology

## 2016-12-11 ENCOUNTER — Telehealth: Payer: Self-pay | Admitting: Obstetrics & Gynecology

## 2016-12-11 NOTE — Telephone Encounter (Signed)
Pt has hx of uterine prolapse that has worsened over the last few days.  Has surgery scheduled in a little over the week.  Reports she is having increased dysuria, urgency and frequency.  Feels like this is a UTI.  She does have appt on Monday with Dr. Phineas Real.  No back pain.  Possible low grade temp.  Hasn't taken it.  No chills.  Feels the increased prolapse may be interfering with voiding.  Is changing positions with voiding to help with complete emptying.   Reviewed chart, hx, medications, allergies.  Last UTI with culture had multiple resistances.  Said last antibiotic worked the best for her.  This was bactrim.  Called in bactrim DS BID x 5 days to [facility].  Instructed pt to try and reduce prolapse to help with better emptying.  States she had not tried this but will.  Strict precautions given for calling back or going to ER if cannot void or symptoms worsen.  Contact number for the weekend given.

## 2016-12-13 NOTE — Patient Instructions (Addendum)
Your procedure is scheduled on:  Tuesday, Aug. 28, 2018  Enter through the [employer] [facility] at:  9:30 AM  Pick up the phone at the desk and dial [phone].  Call this number if you have problems the morning of surgery: [phone].  Remember: Do NOT eat food or drink after:  Midnight Monday  Take these medicines the morning of surgery with a SIP OF WATER:  None, Use eye drops per normal routine  Stop ALL herbal medications at this time  Do NOT smoke the day of surgery.  Do NOT wear jewelry (body piercing), metal hair clips/bobby pins, make-up, artifical eyelashes or nail polish. Do NOT wear lotions, powders, or perfumes.  You may wear deodorant. Do NOT shave for 48 hours prior to surgery. Do NOT bring valuables to the hospital. Contacts, dentures, or bridgework may not be worn into surgery.  Leave suitcase in car.  After surgery it may be brought to your room.  For patients admitted to the hospital, checkout time is 11:00 AM the day of discharge.  Bring a copy of your healthcare power of attorney and living will documents.

## 2016-12-14 ENCOUNTER — Encounter (HOSPITAL_COMMUNITY)
Admission: RE | Admit: 2016-12-14 | Discharge: 2016-12-14 | Disposition: A | Discharge status: Home / Self Care | Payer: PPO | Source: Ambulatory Visit | Attending: Gynecology | Admitting: Gynecology

## 2016-12-14 ENCOUNTER — Other Ambulatory Visit: Payer: Self-pay

## 2016-12-14 ENCOUNTER — Ambulatory Visit (INDEPENDENT_AMBULATORY_CARE_PROVIDER_SITE_OTHER): Payer: PPO | Admitting: Gynecology

## 2016-12-14 ENCOUNTER — Encounter (HOSPITAL_COMMUNITY): Payer: Self-pay

## 2016-12-14 ENCOUNTER — Encounter: Payer: Self-pay | Admitting: Gynecology

## 2016-12-14 VITALS — BP 106/70

## 2016-12-14 DIAGNOSIS — Z01818 Encounter for other preprocedural examination: Secondary | ICD-10-CM | POA: Diagnosis not present

## 2016-12-14 DIAGNOSIS — N814 Uterovaginal prolapse, unspecified: Secondary | ICD-10-CM

## 2016-12-14 DIAGNOSIS — N8111 Cystocele, midline: Secondary | ICD-10-CM

## 2016-12-14 HISTORY — DX: Personal history of other diseases of the digestive system: Z87.19

## 2016-12-14 HISTORY — DX: Raynaud's syndrome without gangrene: I73.00

## 2016-12-14 LAB — CBC
HEMATOCRIT: 34.8 % — AB (ref 36.0–46.0)
Hemoglobin: 12.4 g/dL (ref 12.0–15.0)
MCH: 33.8 pg (ref 26.0–34.0)
MCHC: 35.6 g/dL (ref 30.0–36.0)
MCV: 94.8 fL (ref 78.0–100.0)
PLATELETS: 95 10*3/uL — AB (ref 150–400)
RBC: 3.67 MIL/uL — AB (ref 3.87–5.11)
RDW: 16.8 % — ABNORMAL HIGH (ref 11.5–15.5)
WBC: 10.7 10*3/uL — AB (ref 4.0–10.5)

## 2016-12-14 LAB — COMPREHENSIVE METABOLIC PANEL
ALT: 43 U/L (ref 14–54)
AST: 101 U/L — AB (ref 15–41)
Albumin: 2.6 g/dL — ABNORMAL LOW (ref 3.5–5.0)
Alkaline Phosphatase: 282 U/L — ABNORMAL HIGH (ref 38–126)
Anion gap: 9 (ref 5–15)
BUN: 33 mg/dL — AB (ref 6–20)
CALCIUM: 9 mg/dL (ref 8.9–10.3)
CHLORIDE: 104 mmol/L (ref 101–111)
CO2: 20 mmol/L — AB (ref 22–32)
CREATININE: 1.63 mg/dL — AB (ref 0.44–1.00)
GFR calc Af Amer: 36 mL/min — ABNORMAL LOW (ref >60)
GFR, EST NON AFRICAN AMERICAN: 31 mL/min — AB (ref >60)
Glucose, Bld: 89 mg/dL (ref 65–99)
Potassium: 3.9 mmol/L (ref 3.5–5.1)
Sodium: 133 mmol/L — ABNORMAL LOW (ref 135–145)
Total Bilirubin: 5.2 mg/dL — ABNORMAL HIGH (ref 0.3–1.2)
Total Protein: 6.6 g/dL (ref 6.5–8.1)

## 2016-12-14 LAB — APTT: APTT: 40 s — AB (ref 24–36)

## 2016-12-14 LAB — PROTIME-INR
INR: 1.28
Prothrombin Time: 16.1 seconds — ABNORMAL HIGH (ref 11.4–15.2)

## 2016-12-14 NOTE — Progress Notes (Signed)
Sandra Hobbs [birth year] [id number]        71 y.o.  G2P2 patient presents for preoperative visit for upcoming TVH, BSO, cystocele repair as a combined procedure between myself and Dr. Matilde Sprang.  Past medical history,surgical history, problem list, medications, allergies, family history and social history were all reviewed and documented in the EPIC chart.  Directed ROS with pertinent positives and negatives documented in the history of present illness/assessment and plan.  Exam: [name] assistant Vitals:   12/14/16 1420  BP: 106/70   General appearance:  Normal HEENT normal Lungs clear Cardiac regular rate no rubs murmurs or gallops Abdomen soft nontender without masses guarding rebound. Incisional hernia long cholecystectomy site. Umbilical hernia. Both easily reducible Pelvic external BUS vagina with large cystocele protruding from the introitus and cervix at the level of the introitus. Bimanual uterus normal size midline mobile nontender. Adnexa without gross masses. Minimal rectocele noted on rectal exam.  Assessment/Plan:  71 y.o. G2P2 with grossly symptomatic cystocele and uterine prolapse for TVH, BSO, cystocele repair. I reviewed the proposed surgery with the patient to include the expected intraoperative and postoperative courses. We will attempt hysterectomy vaginally and removal of both ovaries and fallopian tubes. She understands that if I am unable to remove the ovaries or fallopian tubes easily and they appear normal then we will leave them instead of making a more involved surgery or other incisions. She understands by doing so she will retain the risk of ovarian cancer or pathology in the future. She also understands that if any time during the procedure I may convert to an abdominal approach either laparoscopically or with a larger incision if complications arise and that this will require a longer recovery and a larger incision. Sexuality following hysterectomy was  also reviewed in the potential for persistent dyspareunia and orgasmic dysfunction was discussed. She understands that I will be responsible for the hysterectomy and that Dr. Matilde Sprang will be responsible for the bladder repair and postoperative bladder care.  The expected intraoperative and postoperative courses as well as the recovery period were reviewed. The risks of infection, prolonged antibiotics, reoperation for abscess or hematoma formation was discussed. The risks of hemorrhage necessitating transfusion and the risks of transfusion reaction, hepatitis, HIV, mad cow disease and other unknown entities was also discussed. Incisional complications if incisions are made, to include opening and draining of incisions and closure by secondary intention, dehiscence and long-term issues of keloid/cosmetics and hernia formation were reviewed. The risk of inadvertent injury to internal organs including bowel, bladder, ureters, vessels, nerves either immediately recognized or delay recognized necessitating major exploratory reparative surgeries and future reparative surgeries including bowel resection, ostomy formation, bladder repair, ureteral damage repair was discussed with her. The patient's questions were answered to her satisfaction and she is ready to proceed with surgery.  In review of her preoperative blood work her platelet count is 95,000 her PT 16 PTT 40 INR 1.28 with alkaline phosphatase 282 and AST 101. 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 Sarina Ser and relates having abnormal liver functions for several years.  Related to the patient that this may make her bleed more freely during surgery and increased risk of transfusion. She notes that she does not bleed easily with cutting or bruises easily. I'm going to discuss this with Dr. Matilde Sprang and see if he feels comfortable proceeding with surgery given her numbers to include her platelet count of  95,000     Andruw Battie P  MD, 2:50 PM 12/14/2016

## 2016-12-14 NOTE — Patient Instructions (Signed)
Office will call you in reference to the surgery.

## 2016-12-15 ENCOUNTER — Telehealth: Payer: Self-pay | Admitting: Gynecology

## 2016-12-15 NOTE — Telephone Encounter (Signed)
I called the patient this morning after reviewing her lab work and her most recent abdominal CT scan 11/17/2016. CT scan reports marked cirrhosis with portal hypertension and gastric varices. Lab work shows elevated alkaline phosphatase at 283 and AST at 101 consistent with her history of biliary cirrhosis. Her INR, PT and PTT are all mildly elevated. Platelet count is 95,000. I discussed with the patient that I did not think it was wise to proceed with surgery at this time for several reasons. From an anesthetic standpoint stressing her liver which is already showing some evidence of dysfunction as well as the issues of postoperative nausea and vomiting stressing her varices potentially. Mildly elevated PT/PTT with mildly decreased platelet count leading to potentially increased risks of infection given the dissection with the cystocele repair and her hysterectomy. My recommendation would be to consider pessary at this time for temporary/permanent symptom relief and if she is interested in pursuing surgery arrange consult appointment with Dr. Maryland Pink from [facility] who, if she would feel surgery would be appropriate it would be in the setting of a [facility] with the additional support. Patient understands the decision and does want me to make an appointment for pessary trial. We'll also make an appointment for her to see Dr. Zigmund Daniel.

## 2016-12-16 ENCOUNTER — Telehealth: Payer: Self-pay | Admitting: *Deleted

## 2016-12-16 NOTE — Telephone Encounter (Signed)
Pt called asking if you recommend a MD that could help her with her hernia problem as well? Please advise

## 2016-12-17 LAB — TYPE AND SCREEN
ABO/RH(D): A NEG
Antibody Screen: POSITIVE
DAT, IGG: NEGATIVE
UNIT DIVISION: 0
Unit division: 0

## 2016-12-17 LAB — BPAM RBC
BLOOD PRODUCT EXPIRATION DATE: 201808312359
BLOOD PRODUCT EXPIRATION DATE: 201809082359
Unit Type and Rh: 9500
Unit Type and Rh: 9500

## 2016-12-17 NOTE — Telephone Encounter (Signed)
I thought she already saw someone in reference to this?  Any of the general surgeons could handle this, ie Dr Harlow Asa, Zella Richer, Hassell Done

## 2016-12-17 NOTE — Telephone Encounter (Signed)
Office visit appointment for this patient for pessary fitting   Appointment with Dr. Maryland Pink reference prolapse with medical issues. If she would decide on surgery that I think a [facility] would be the most appropriate place to manage her other issues. "   Pt scheduled on 02/16/17 @ 11:15am with Dr.Matthews notes faxed to [phone] pt aware of time and date. Pt will call back to schedule pessary fitting appointment.

## 2016-12-20 ENCOUNTER — Encounter: Payer: Self-pay | Admitting: Gynecology

## 2016-12-20 ENCOUNTER — Ambulatory Visit (INDEPENDENT_AMBULATORY_CARE_PROVIDER_SITE_OTHER): Payer: PPO | Admitting: Gynecology

## 2016-12-20 VITALS — BP 120/70

## 2016-12-20 DIAGNOSIS — N814 Uterovaginal prolapse, unspecified: Secondary | ICD-10-CM | POA: Diagnosis not present

## 2016-12-20 DIAGNOSIS — N8111 Cystocele, midline: Secondary | ICD-10-CM | POA: Diagnosis not present

## 2016-12-20 NOTE — Progress Notes (Signed)
    Sandra Hobbs 06-24-1945 [id number]        71 y.o.  G2P2 presents for pessary fitting. History of cystocele, uterine prolapse, rectocele. Was scheduled for TVH, BSO with cystocele repair. Preoperative blood work was found to be abnormal showing mild elevations in her PT, PTT and INR. Also showed platelet count of 95,000. Does have a history of biliary cirrhosis. Most recent CT scan end of July showed gastric varices. It was felt unwise to proceed with surgery at this time area she does have an appointment with Dr. Zigmund Daniel who if felt surgery would be appropriate would be able to do this at the [facility] setting. She presents now for pessary fitting to provide her with some symptomatic relief in the interim.  Past medical history,surgical history, problem list, medications, allergies, family history and social history were all reviewed and documented in the EPIC chart.  Directed ROS with pertinent positives and negatives documented in the history of present illness/assessment and plan.  Exam: [name] assistant Vitals:   12/20/16 1012  BP: 120/70   General appearance:  Normal Abdomen soft nontender without masses or guarding. Pelvic external BUS vagina with atrophic changes. Large cystocele protruding from the vagina. Cervix at the introitus. Uterus grossly normal midline mobile. Adnexa without masses or tenderness.  Various pessaries were placed to include ring and Gellhorn. Gellhorn 2 3/4 pessary felt to be the best fit.  she ambulated and squatted with it remaining in place and without discomfort.  Assessment/Plan:  71 y.o. G2P2 With cystocele, rectocele, uterine prolapse. Successful placement of pessary. Patient will follow up when the permanent pessary is available for placement. She'll continue to follow up with Dr. Zigmund Daniel for reassessment of her prolapse.    [name] MD, 10:31 AM 12/20/2016

## 2016-12-20 NOTE — Patient Instructions (Signed)
Office will call you when the pessary becomes available

## 2016-12-21 ENCOUNTER — Inpatient Hospital Stay (HOSPITAL_COMMUNITY): Admission: RE | Admit: 2016-12-21 | Payer: PPO | Source: Ambulatory Visit | Admitting: Gynecology

## 2016-12-21 ENCOUNTER — Telehealth: Payer: Self-pay | Admitting: *Deleted

## 2016-12-21 ENCOUNTER — Encounter (HOSPITAL_COMMUNITY): Admission: RE | Payer: Self-pay | Source: Ambulatory Visit

## 2016-12-21 SURGERY — HYSTERECTOMY, VAGINAL
Anesthesia: General

## 2016-12-21 NOTE — Telephone Encounter (Signed)
Pt aware, referral faxed to [facility] they will contact pt to schedule.

## 2016-12-21 NOTE — Telephone Encounter (Signed)
Pt asked if you would be willing to refer her to [facility] for "Incisional hernia at her gallbladder incision site." pt said her PCP was suppose to help her with this,but she has not heard back from them. Patient said she has called twice and left messages. Please advise

## 2016-12-21 NOTE — Telephone Encounter (Signed)
Ok for referral?

## 2016-12-23 ENCOUNTER — Encounter: Payer: Self-pay | Admitting: Gynecology

## 2016-12-23 ENCOUNTER — Ambulatory Visit (INDEPENDENT_AMBULATORY_CARE_PROVIDER_SITE_OTHER): Payer: PPO | Admitting: Gynecology

## 2016-12-23 VITALS — BP 122/74

## 2016-12-23 DIAGNOSIS — N816 Rectocele: Secondary | ICD-10-CM | POA: Diagnosis not present

## 2016-12-23 DIAGNOSIS — N814 Uterovaginal prolapse, unspecified: Secondary | ICD-10-CM

## 2016-12-23 DIAGNOSIS — N8111 Cystocele, midline: Secondary | ICD-10-CM | POA: Diagnosis not present

## 2016-12-23 NOTE — Patient Instructions (Signed)
Follow up in one month for reexamination

## 2016-12-23 NOTE — Progress Notes (Signed)
    Sandra Hobbs May 24, 1945 [id number]        71 y.o.  G2P2 presents for pessary placement with large cystocele, uterine prolapse and rectocele.  Past medical history,surgical history, problem list, medications, allergies, family history and social history were all reviewed and documented in the EPIC chart.  Directed ROS with pertinent positives and negatives documented in the history of present illness/assessment and plan.  Exam: [name] assistant Vitals:   12/23/16 1532  BP: 122/74   General appearance:  Normal External BUS vagina with atrophic changes. Large cystocele/uterine prolapse/rectocele noted. Bimanual exam without masses or tenderness.  The patient's prolapse was reduced and the Gellhorn 2 3/4 was placed without difficulty.  Assessment/Plan:  71 y.o. G2P2 with good reduction of her prolapse with a Gellhorn pessary. Patient will follow up in one month for reexamination. Sooner if any issues.    [name] MD, 3:50 PM 12/23/2016

## 2016-12-28 NOTE — Telephone Encounter (Signed)
Pt scheduled on 12/30/16 @ 9:50am with Dr.Ramirez

## 2016-12-30 DIAGNOSIS — K432 Incisional hernia without obstruction or gangrene: Secondary | ICD-10-CM | POA: Diagnosis not present

## 2017-01-18 ENCOUNTER — Encounter: Payer: Self-pay | Admitting: Gynecology

## 2017-01-18 ENCOUNTER — Ambulatory Visit (INDEPENDENT_AMBULATORY_CARE_PROVIDER_SITE_OTHER): Payer: PPO | Admitting: Gynecology

## 2017-01-18 VITALS — BP 120/64

## 2017-01-18 DIAGNOSIS — N814 Uterovaginal prolapse, unspecified: Secondary | ICD-10-CM

## 2017-01-18 DIAGNOSIS — N8111 Cystocele, midline: Secondary | ICD-10-CM | POA: Diagnosis not present

## 2017-01-18 NOTE — Progress Notes (Signed)
    Sandra Hobbs 1945-04-29 [id number]        72 y.o.  G2P2 presents for her 1 month follow up visit after being fitted with a Gellhorn pessary for her large cystocele/uterine prolapse. She's done well reporting no issues or bleeding.  Past medical history,surgical history, problem list, medications, allergies, family history and social history were all reviewed and documented in the EPIC chart.  Directed ROS with pertinent positives and negatives documented in the history of present illness/assessment and plan.  Exam: [name] assistant Vitals:   01/18/17 1436  BP: 120/64   General appearance:  Normal External BUS vagina normal with atrophic changes. Cervix with atrophic changes. Uterus grossly normal midline mobile nontender. Adnexa without masses or tenderness.  Patients Gellhorn pessary was removed cleansed and replaced. It does have a tight introital fit but appears to support her well and she tolerated the removal and replacement.  Assessment/Plan:  71 y.o. G2P2 with large cystocele/uterine prolapse doing well with Gellhorn pessary. Patient has an appointment actually to see Dr. Zigmund Daniel to consider surgical repair. She's going to see me one or 2 days before hand to remove the pessary to allow Dr. Zigmund Daniel to have a realistic exam of her situation.  Otherwise I recommended she follow up in 3 months to have the pessary recheck.    [name] MD, 2:48 PM 01/18/2017

## 2017-01-18 NOTE — Patient Instructions (Signed)
Follow up before your appointment with Dr. Zigmund Daniel to have the pessary removed.

## 2017-01-26 DIAGNOSIS — R188 Other ascites: Secondary | ICD-10-CM | POA: Diagnosis not present

## 2017-01-26 DIAGNOSIS — K743 Primary biliary cirrhosis: Secondary | ICD-10-CM | POA: Diagnosis not present

## 2017-01-26 DIAGNOSIS — N1330 Unspecified hydronephrosis: Secondary | ICD-10-CM | POA: Diagnosis not present

## 2017-01-26 DIAGNOSIS — N814 Uterovaginal prolapse, unspecified: Secondary | ICD-10-CM | POA: Diagnosis not present

## 2017-01-26 DIAGNOSIS — M810 Age-related osteoporosis without current pathological fracture: Secondary | ICD-10-CM | POA: Diagnosis not present

## 2017-01-26 DIAGNOSIS — K7469 Other cirrhosis of liver: Secondary | ICD-10-CM | POA: Diagnosis not present

## 2017-01-27 ENCOUNTER — Telehealth: Payer: Self-pay | Admitting: *Deleted

## 2017-01-27 NOTE — Telephone Encounter (Signed)
Patient called back stating she had appointment at [facility] with the liver clinic and her medication has been increased and labs were drawn. Pt scheduled to see Dr.Matthews consult visit on 02/16/17 for possible surgery. Pt said her liver doctor told her she doesn't want her having any surgery prior to having her "liver issue" taken care of as pt may need a liver transplant. Pt wanted to update me with this information she has the number to Dr. Zigmund Daniel office she will call to cancel appointment.

## 2017-01-27 NOTE — Telephone Encounter (Signed)
Pt called and left message in triage voicemail asking for a return call. I called pt back and received voicemail, left message for her to call me.

## 2017-02-15 ENCOUNTER — Ambulatory Visit: Payer: PPO | Admitting: Gynecology

## 2017-02-21 DIAGNOSIS — K745 Biliary cirrhosis, unspecified: Secondary | ICD-10-CM | POA: Diagnosis not present

## 2017-03-04 DIAGNOSIS — K7460 Unspecified cirrhosis of liver: Secondary | ICD-10-CM | POA: Diagnosis not present

## 2017-03-04 DIAGNOSIS — Z86010 Personal history of colonic polyps: Secondary | ICD-10-CM | POA: Diagnosis not present

## 2017-03-04 DIAGNOSIS — K621 Rectal polyp: Secondary | ICD-10-CM | POA: Diagnosis not present

## 2017-03-04 DIAGNOSIS — K766 Portal hypertension: Secondary | ICD-10-CM | POA: Diagnosis not present

## 2017-03-04 DIAGNOSIS — K3189 Other diseases of stomach and duodenum: Secondary | ICD-10-CM | POA: Diagnosis not present

## 2017-03-04 DIAGNOSIS — K222 Esophageal obstruction: Secondary | ICD-10-CM | POA: Diagnosis not present

## 2017-03-04 DIAGNOSIS — K449 Diaphragmatic hernia without obstruction or gangrene: Secondary | ICD-10-CM | POA: Diagnosis not present

## 2017-03-04 DIAGNOSIS — K635 Polyp of colon: Secondary | ICD-10-CM | POA: Diagnosis not present

## 2017-03-04 DIAGNOSIS — D126 Benign neoplasm of colon, unspecified: Secondary | ICD-10-CM | POA: Diagnosis not present

## 2017-03-10 ENCOUNTER — Inpatient Hospital Stay (HOSPITAL_COMMUNITY): Payer: PPO

## 2017-03-10 ENCOUNTER — Encounter (HOSPITAL_COMMUNITY): Payer: Self-pay | Admitting: Emergency Medicine

## 2017-03-10 ENCOUNTER — Inpatient Hospital Stay (HOSPITAL_COMMUNITY)
Admission: EM | Admit: 2017-03-10 | Discharge: 2017-03-13 | DRG: 871 | Disposition: A | Discharge status: Other Acute Inpatient Hospital | Payer: PPO | Attending: Pulmonary Disease | Admitting: Pulmonary Disease

## 2017-03-10 ENCOUNTER — Other Ambulatory Visit: Payer: Self-pay

## 2017-03-10 DIAGNOSIS — K3189 Other diseases of stomach and duodenum: Secondary | ICD-10-CM | POA: Diagnosis not present

## 2017-03-10 DIAGNOSIS — R918 Other nonspecific abnormal finding of lung field: Secondary | ICD-10-CM | POA: Diagnosis not present

## 2017-03-10 DIAGNOSIS — I9589 Other hypotension: Secondary | ICD-10-CM | POA: Diagnosis not present

## 2017-03-10 DIAGNOSIS — R188 Other ascites: Secondary | ICD-10-CM | POA: Diagnosis not present

## 2017-03-10 DIAGNOSIS — Z881 Allergy status to other antibiotic agents status: Secondary | ICD-10-CM

## 2017-03-10 DIAGNOSIS — Z515 Encounter for palliative care: Secondary | ICD-10-CM | POA: Diagnosis not present

## 2017-03-10 DIAGNOSIS — R5383 Other fatigue: Secondary | ICD-10-CM | POA: Diagnosis not present

## 2017-03-10 DIAGNOSIS — G9340 Encephalopathy, unspecified: Secondary | ICD-10-CM | POA: Diagnosis not present

## 2017-03-10 DIAGNOSIS — K652 Spontaneous bacterial peritonitis: Secondary | ICD-10-CM | POA: Diagnosis present

## 2017-03-10 DIAGNOSIS — R52 Pain, unspecified: Secondary | ICD-10-CM

## 2017-03-10 DIAGNOSIS — D6959 Other secondary thrombocytopenia: Secondary | ICD-10-CM | POA: Diagnosis present

## 2017-03-10 DIAGNOSIS — K449 Diaphragmatic hernia without obstruction or gangrene: Secondary | ICD-10-CM | POA: Diagnosis not present

## 2017-03-10 DIAGNOSIS — K635 Polyp of colon: Secondary | ICD-10-CM | POA: Diagnosis not present

## 2017-03-10 DIAGNOSIS — K7290 Hepatic failure, unspecified without coma: Secondary | ICD-10-CM | POA: Diagnosis present

## 2017-03-10 DIAGNOSIS — K429 Umbilical hernia without obstruction or gangrene: Secondary | ICD-10-CM | POA: Diagnosis present

## 2017-03-10 DIAGNOSIS — M19011 Primary osteoarthritis, right shoulder: Secondary | ICD-10-CM | POA: Diagnosis not present

## 2017-03-10 DIAGNOSIS — N390 Urinary tract infection, site not specified: Secondary | ICD-10-CM | POA: Diagnosis present

## 2017-03-10 DIAGNOSIS — H1851 Endothelial corneal dystrophy: Secondary | ICD-10-CM | POA: Diagnosis not present

## 2017-03-10 DIAGNOSIS — N170 Acute kidney failure with tubular necrosis: Secondary | ICD-10-CM | POA: Diagnosis not present

## 2017-03-10 DIAGNOSIS — R0689 Other abnormalities of breathing: Secondary | ICD-10-CM | POA: Diagnosis not present

## 2017-03-10 DIAGNOSIS — R6521 Severe sepsis with septic shock: Secondary | ICD-10-CM | POA: Diagnosis present

## 2017-03-10 DIAGNOSIS — B952 Enterococcus as the cause of diseases classified elsewhere: Secondary | ICD-10-CM | POA: Diagnosis not present

## 2017-03-10 DIAGNOSIS — B9620 Unspecified Escherichia coli [E. coli] as the cause of diseases classified elsewhere: Secondary | ICD-10-CM | POA: Diagnosis not present

## 2017-03-10 DIAGNOSIS — Z9049 Acquired absence of other specified parts of digestive tract: Secondary | ICD-10-CM

## 2017-03-10 DIAGNOSIS — J9600 Acute respiratory failure, unspecified whether with hypoxia or hypercapnia: Secondary | ICD-10-CM

## 2017-03-10 DIAGNOSIS — D126 Benign neoplasm of colon, unspecified: Secondary | ICD-10-CM | POA: Diagnosis not present

## 2017-03-10 DIAGNOSIS — D696 Thrombocytopenia, unspecified: Secondary | ICD-10-CM | POA: Diagnosis present

## 2017-03-10 DIAGNOSIS — E860 Dehydration: Secondary | ICD-10-CM | POA: Diagnosis not present

## 2017-03-10 DIAGNOSIS — K766 Portal hypertension: Secondary | ICD-10-CM | POA: Diagnosis present

## 2017-03-10 DIAGNOSIS — K767 Hepatorenal syndrome: Secondary | ICD-10-CM | POA: Diagnosis present

## 2017-03-10 DIAGNOSIS — K7460 Unspecified cirrhosis of liver: Secondary | ICD-10-CM | POA: Diagnosis not present

## 2017-03-10 DIAGNOSIS — A4150 Gram-negative sepsis, unspecified: Secondary | ICD-10-CM | POA: Diagnosis not present

## 2017-03-10 DIAGNOSIS — R161 Splenomegaly, not elsewhere classified: Secondary | ICD-10-CM | POA: Diagnosis not present

## 2017-03-10 DIAGNOSIS — E440 Moderate protein-calorie malnutrition: Secondary | ICD-10-CM | POA: Diagnosis not present

## 2017-03-10 DIAGNOSIS — Z01818 Encounter for other preprocedural examination: Secondary | ICD-10-CM

## 2017-03-10 DIAGNOSIS — K743 Primary biliary cirrhosis: Secondary | ICD-10-CM | POA: Diagnosis not present

## 2017-03-10 DIAGNOSIS — R17 Unspecified jaundice: Secondary | ICD-10-CM

## 2017-03-10 DIAGNOSIS — G9341 Metabolic encephalopathy: Secondary | ICD-10-CM | POA: Diagnosis present

## 2017-03-10 DIAGNOSIS — Z885 Allergy status to narcotic agent status: Secondary | ICD-10-CM

## 2017-03-10 DIAGNOSIS — Z888 Allergy status to other drugs, medicaments and biological substances status: Secondary | ICD-10-CM

## 2017-03-10 DIAGNOSIS — R109 Unspecified abdominal pain: Secondary | ICD-10-CM | POA: Diagnosis present

## 2017-03-10 HISTORY — DX: Other specified postprocedural states: Z98.890

## 2017-03-10 LAB — COMPREHENSIVE METABOLIC PANEL
ALK PHOS: 186 U/L — AB (ref 38–126)
ALT: 56 U/L — AB (ref 14–54)
ANION GAP: 13 (ref 5–15)
AST: 102 U/L — ABNORMAL HIGH (ref 15–41)
Albumin: 1.9 g/dL — ABNORMAL LOW (ref 3.5–5.0)
BILIRUBIN TOTAL: 15.7 mg/dL — AB (ref 0.3–1.2)
BUN: 47 mg/dL — ABNORMAL HIGH (ref 6–20)
CALCIUM: 12 mg/dL — AB (ref 8.9–10.3)
CO2: 20 mmol/L — ABNORMAL LOW (ref 22–32)
CREATININE: 1.48 mg/dL — AB (ref 0.44–1.00)
Chloride: 103 mmol/L (ref 101–111)
GFR calc non Af Amer: 35 mL/min — ABNORMAL LOW (ref >60)
GFR, EST AFRICAN AMERICAN: 40 mL/min — AB (ref >60)
Glucose, Bld: 81 mg/dL (ref 65–99)
Potassium: 3.6 mmol/L (ref 3.5–5.1)
Sodium: 136 mmol/L (ref 135–145)
TOTAL PROTEIN: 5.9 g/dL — AB (ref 6.5–8.1)

## 2017-03-10 LAB — URINALYSIS, ROUTINE W REFLEX MICROSCOPIC
Glucose, UA: NEGATIVE mg/dL
Ketones, ur: NEGATIVE mg/dL
NITRITE: NEGATIVE
PH: 5 (ref 5.0–8.0)
Protein, ur: 30 mg/dL — AB
SPECIFIC GRAVITY, URINE: 1.018 (ref 1.005–1.030)

## 2017-03-10 LAB — GRAM STAIN

## 2017-03-10 LAB — BODY FLUID CELL COUNT WITH DIFFERENTIAL
Lymphs, Fluid: 28 %
MONOCYTE-MACROPHAGE-SEROUS FLUID: 7 % — AB (ref 50–90)
Neutrophil Count, Fluid: 65 % — ABNORMAL HIGH (ref 0–25)
Total Nucleated Cell Count, Fluid: 7800 cu mm — ABNORMAL HIGH (ref 0–1000)

## 2017-03-10 LAB — PROTEIN, PLEURAL OR PERITONEAL FLUID

## 2017-03-10 LAB — ALBUMIN, PLEURAL OR PERITONEAL FLUID

## 2017-03-10 LAB — CBC
HCT: 38.8 % (ref 36.0–46.0)
HEMOGLOBIN: 13.4 g/dL (ref 12.0–15.0)
MCH: 32.9 pg (ref 26.0–34.0)
MCHC: 34.5 g/dL (ref 30.0–36.0)
MCV: 95.3 fL (ref 78.0–100.0)
PLATELETS: 93 10*3/uL — AB (ref 150–400)
RBC: 4.07 MIL/uL (ref 3.87–5.11)
RDW: 19.5 % — ABNORMAL HIGH (ref 11.5–15.5)
WBC: 6.9 10*3/uL (ref 4.0–10.5)

## 2017-03-10 LAB — PROTIME-INR
INR: 1.46
PROTHROMBIN TIME: 17.6 s — AB (ref 11.4–15.2)

## 2017-03-10 LAB — LIPASE, BLOOD: Lipase: 28 U/L (ref 11–51)

## 2017-03-10 LAB — LACTATE DEHYDROGENASE, PLEURAL OR PERITONEAL FLUID: LD FL: 620 U/L — AB (ref 3–23)

## 2017-03-10 LAB — AMMONIA: AMMONIA: 39 umol/L — AB (ref 9–35)

## 2017-03-10 MED ORDER — PREDNISOLONE ACETATE 1 % OP SUSP
1.0000 [drp] | Freq: Two times a day (BID) | OPHTHALMIC | Status: DC
  Administered 2017-03-10 – 2017-03-13 (×6): 1 [drp] via OPHTHALMIC
  Filled 2017-03-10: qty 5

## 2017-03-10 MED ORDER — TIMOLOL HEMIHYDRATE 0.5 % OP SOLN: 1.0000 [drp] | Freq: Two times a day (BID) | OPHTHALMIC | Status: DC

## 2017-03-10 MED ORDER — MORPHINE SULFATE (PF) 4 MG/ML IV SOLN: 1.0000 mg | INTRAVENOUS | Status: DC | PRN

## 2017-03-10 MED ORDER — ACETAMINOPHEN 325 MG PO TABS
650.0000 mg | ORAL_TABLET | Freq: Four times a day (QID) | ORAL | Status: DC | PRN
  Administered 2017-03-11: 650 mg via ORAL
  Filled 2017-03-10: qty 2

## 2017-03-10 MED ORDER — ALBUMIN HUMAN 25 % IV SOLN
12.5000 g | Freq: Once | INTRAVENOUS | Status: AC
  Administered 2017-03-10: 12.5 g via INTRAVENOUS
  Filled 2017-03-10 (×2): qty 50

## 2017-03-10 MED ORDER — DEXTROSE 5 % IV SOLN: 1.0000 g | INTRAVENOUS | Status: DC

## 2017-03-10 MED ORDER — DEXTROSE 5 % IV SOLN
2.0000 g | INTRAVENOUS | Status: DC
  Administered 2017-03-11 – 2017-03-12 (×2): 2 g via INTRAVENOUS
  Filled 2017-03-10 (×3): qty 2

## 2017-03-10 MED ORDER — URSODIOL 300 MG PO CAPS
300.0000 mg | ORAL_CAPSULE | Freq: Two times a day (BID) | ORAL | Status: DC
  Administered 2017-03-10 – 2017-03-13 (×4): 300 mg via ORAL
  Filled 2017-03-10 (×7): qty 1

## 2017-03-10 MED ORDER — SODIUM CHLORIDE 0.9 % IV BOLUS (SEPSIS)
1000.0000 mL | Freq: Once | INTRAVENOUS | Status: AC
  Administered 2017-03-10: 1000 mL via INTRAVENOUS

## 2017-03-10 MED ORDER — TIMOLOL MALEATE 0.5 % OP SOLN
1.0000 [drp] | Freq: Two times a day (BID) | OPHTHALMIC | Status: DC
  Administered 2017-03-10 – 2017-03-13 (×6): 1 [drp] via OPHTHALMIC
  Filled 2017-03-10: qty 5

## 2017-03-10 MED ORDER — ONDANSETRON HCL 4 MG PO TABS: 4.0000 mg | ORAL_TABLET | Freq: Four times a day (QID) | ORAL | Status: DC | PRN

## 2017-03-10 MED ORDER — ALBUMIN HUMAN 25 % IV SOLN: 12.5000 g | Freq: Once | INTRAVENOUS | Status: DC

## 2017-03-10 MED ORDER — DEXTROSE 5 % IV SOLN
1.0000 g | Freq: Once | INTRAVENOUS | Status: AC
  Administered 2017-03-10: 1 g via INTRAVENOUS
  Filled 2017-03-10: qty 10

## 2017-03-10 MED ORDER — SODIUM CHLORIDE 0.9 % IV BOLUS (SEPSIS): 500.0000 mL | Freq: Once | INTRAVENOUS | Status: DC

## 2017-03-10 MED ORDER — SODIUM CHLORIDE 0.9 % IV SOLN
INTRAVENOUS | Status: AC
  Administered 2017-03-10 – 2017-03-11 (×2): via INTRAVENOUS

## 2017-03-10 MED ORDER — LIDOCAINE HCL 1 % IJ SOLN
INTRAMUSCULAR | Status: AC
  Filled 2017-03-10: qty 10

## 2017-03-10 MED ORDER — SODIUM CHLORIDE (HYPERTONIC) 2 % OP SOLN
1.0000 [drp] | Freq: Two times a day (BID) | OPHTHALMIC | Status: DC
  Administered 2017-03-10 – 2017-03-13 (×5): 1 [drp] via OPHTHALMIC
  Filled 2017-03-10: qty 15

## 2017-03-10 MED ORDER — SODIUM CHLORIDE 0.9 % IV SOLN: INTRAVENOUS | Status: DC

## 2017-03-10 MED ORDER — ONDANSETRON HCL 4 MG/2ML IJ SOLN
4.0000 mg | Freq: Four times a day (QID) | INTRAMUSCULAR | Status: DC | PRN
Start: 2017-03-10 — End: 2017-03-13

## 2017-03-10 MED ORDER — MORPHINE SULFATE (PF) 2 MG/ML IV SOLN
2.0000 mg | Freq: Once | INTRAVENOUS | Status: AC
  Administered 2017-03-10: 2 mg via INTRAVENOUS
  Filled 2017-03-10: qty 1

## 2017-03-10 MED ORDER — MORPHINE SULFATE (PF) 2 MG/ML IV SOLN: 1.0000 mg | INTRAVENOUS | Status: DC | PRN

## 2017-03-10 MED ORDER — ACETAMINOPHEN 650 MG RE SUPP: 650.0000 mg | Freq: Four times a day (QID) | RECTAL | Status: DC | PRN

## 2017-03-10 NOTE — ED Triage Notes (Signed)
Sandra Hobbs is sending pt d/t jaundice and SOB. MD is reccomending paracentesis and labs. Pt is on liver transplant list at [facility].  Dr. Lizbeth Bark can be reached at [phone]

## 2017-03-10 NOTE — ED Triage Notes (Signed)
EDP at bedside. Husband at bedside

## 2017-03-10 NOTE — ED Triage Notes (Addendum)
Pt sent by PCP , escorted by husband , with c/o jaundice , increased abdominal pain and abdominal swelling. Denies NVD. Pt is very weak, appropriate and oriented. Pt and husband stated that she has been feeling much worse since her coloscopy and endoscopy one week ago. Pt is a transplant candidate being evaluated at [facility]

## 2017-03-10 NOTE — Procedures (Signed)
Ultrasound-guided diagnostic and therapeutic paracentesis performed yielding 3.8 liters of turbid, golden yellow  fluid. No immediate complications. A portion of the fluid was sent to the lab for preordered studies.

## 2017-03-10 NOTE — H&P (Signed)
[facility] History and Physical  Sandra Hobbs IHK:[id number] DOB: 1946/03/23 DOA: 03/10/2017   PCP: Deland Pretty, MD  Specialists: Dr. Watt Climes is her gastroenterologist.  Chief Complaint: Worsening abdominal pain and jaundice  HPI: Sandra Hobbs is a 71 y.o. female with a past medical history of primary biliary cirrhosis who is under the care of a gastroenterologist has been experiencing worsening abdominal discomfort and jaundice over the past many weeks.  She was referred to the transplant center at [facility] for evaluation.  She was seen there about a week ago when she underwent upper and lower endoscopies.  Plan is for her to be reevaluated after Thanksgiving.  In the meantime patient's abdominal pain has worsened.  She has experienced worsening distention of her abdomen.  The jaundice is also worsened.  The pain is 4 out of 10 in intensity in the abdomen without any precipitating aggravating or relieving factors.  She denies any fever or chills.  Some nausea but no vomiting.  No diarrhea.  The patient was recently taken off of diuretics and then resumed due to increase in abdominal girth.  The patient feels like she is extremely dry in her mouth.  She was seen at her gastroenterologist office today and was referred to the hospital for further evaluation and management.  Home Medications: Prior to Admission medications   Medication Sig Start Date End Date Taking? Authorizing Provider  CALCIUM PO Take 2 tablets by mouth daily.   Yes [provider]  cholecalciferol (VITAMIN D) 1000 units tablet Take 1,000 Units by mouth daily.    Yes [provider]  furosemide (LASIX) 20 MG tablet Take 20 mg daily by mouth. 03/04/17  Yes [provider]  prednisoLONE acetate (PRED FORTE) 1 % ophthalmic suspension Place 1 drop into the right eye 2 (two) times daily.    Yes [provider]  sodium chloride (MURO 128) 2 % ophthalmic solution Place 1 drop into  the left eye 2 (two) times daily.    Yes [provider]  spironolactone (ALDACTONE) 100 MG tablet Take 100 mg daily by mouth. 03/04/17  Yes [provider]  timolol (BETIMOL) 0.5 % ophthalmic solution Place 1 drop into the right eye 2 (two) times daily.    Yes [provider]  ursodiol (ACTIGALL) 300 MG capsule Take 300 mg by mouth 2 (two) times daily.   Yes [provider]  Probiotic Product (PROBIOTIC PO) Take by mouth.    [provider]    Allergies:  Allergies  Allergen Reactions  . Codeine Other (See Comments)    Hallucinate  . Valacyclovir Nausea And Vomiting  . Amoxicillin Rash    Past Medical History: Past Medical History:  Diagnosis Date  . ASCUS favor benign 09/2015   negative HR HPV  recommend repeat pap one year  . Cirrhosis, biliary ([place])    primary  . Fuchs' corneal dystrophy   . History of hiatal hernia   . Hx of colonoscopy   . Osteoporosis   . Raynaud disease   . 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 APPENDECTOMY    . CATARACT EXTRACTION    . CHOLECYSTECTOMY    . COLONOSCOPY    . CORNEAL TRANSPLANT    . eyelid surg    . KNEE SURGERY Right    twice  . REFRACTIVE SURGERY    . RETINAL DETACHMENT SURGERY    .  TONSILLECTOMY    . TUBAL LIGATION      Social History:  Social History   Socioeconomic History  . Marital status: Married    Spouse name: Not on file  . Number of children: Not on file  . Years of education: Not on file  . Highest education level: Not on file  Social Needs  . Financial resource strain: Not on file  . Food insecurity - worry: Not on file  . Food insecurity - inability: Not on file  . Transportation needs - medical: Not on file  . Transportation needs - non-medical: Not on file  Occupational History  . Not on file  Tobacco Use  . Smoking status: Never Smoker  . Smokeless tobacco: Never Used   Substance and Sexual Activity  . Alcohol use: No    Alcohol/week: 0.0 oz  . Drug use: No  . Sexual activity: Not Currently    Comment: 1st intercourse 9 yo-1 partner  Other Topics Concern  . Not on file  Social History Narrative  . Not on file    Family History:  Family History  Problem Relation Age of Onset  . Stroke Mother   . Heart disease Father   . Aneurysm Father   . Diabetes Brother   . Heart failure Paternal Grandfather      Review of Systems - History obtained from the patient General ROS: positive for  - fatigue Psychological ROS: negative Ophthalmic ROS: negative ENT ROS: negative Allergy and Immunology ROS: negative Hematological and Lymphatic ROS: negative Endocrine ROS: negative Respiratory ROS: no cough, shortness of breath, or wheezing Cardiovascular ROS: no chest pain or dyspnea on exertion Gastrointestinal ROS: as in hpi Genito-Urinary ROS: no dysuria, trouble voiding, or hematuria Musculoskeletal ROS: negative Neurological ROS: no TIA or stroke symptoms Dermatological ROS: negative  Physical Examination  Vitals:   03/10/17 1429 03/10/17 1440 03/10/17 1445 03/10/17 1457  BP: 101/61 (!) 91/46 (!) 92/52 (!) 98/51  Pulse:      Resp:      Temp:      TempSrc:      SpO2:        BP (!) 98/51   Pulse 95   Temp (!) 97.3 F (36.3 C) (Oral)   Resp 20   SpO2 94%   General appearance: alert, cooperative, appears stated age and no distress Head: Normocephalic, without obvious abnormality, atraumatic Eyes: conjunctivae/corneas clear. PERRL, EOM's intact., Icteric sclera Throat: Extremely dry mucous membranes without any obvious oral lesions Neck: no adenopathy, no carotid bruit, no JVD, supple, symmetrical, trachea midline and thyroid not enlarged, symmetric, no tenderness/mass/nodules Resp: clear to auscultation bilaterally Cardio: regular rate and rhythm, S1, S2 normal, no murmur, click, rub or gallop GI: Abdomen is distended.  Shifting dullness  is noted.  Ascites is present.  Tender to palpation.  No obvious masses.  Liver is palpable. Extremities: 2-3+ pitting edema noted bilateral lower extremities.  Restricted range of motion of the right shoulder. Pulses: 2+ and symmetric Skin: Skin color, texture, turgor normal. No rashes or lesions Lymph nodes: Cervical, supraclavicular, and axillary nodes normal. Neurologic: Seems mildly distracted but no focal neurological deficits noted.   Labs on Admission: I have personally reviewed following labs and imaging studies  CBC: Recent Labs  Lab 03/10/17 1103  WBC 6.9  HGB 13.4  HCT 38.8  MCV 95.3  PLT 93*   Basic Metabolic Panel: Recent Labs  Lab 03/10/17 1103  NA 136  K 3.6  CL 103  CO2  20*  GLUCOSE 81  BUN 47*  CREATININE 1.48*  CALCIUM 12.0*   GFR: CrCl cannot be calculated (Unknown ideal weight.). Liver Function Tests: Recent Labs  Lab 03/10/17 1103  AST 102*  ALT 56*  ALKPHOS 186*  BILITOT 15.7*  PROT 5.9*  ALBUMIN 1.9*   Recent Labs  Lab 03/10/17 1103  LIPASE 28   Recent Labs  Lab 03/10/17 1103  AMMONIA 39*   Coagulation Profile: Recent Labs  Lab 03/10/17 1103  INR 1.46     Radiological Exams on Admission: Dg Shoulder Right  Result Date: 03/10/2017 CLINICAL DATA:  Pain in the right shoulder EXAM: RIGHT SHOULDER - 2+ VIEW COMPARISON:  None. FINDINGS: No fracture or malalignment. Mild AC joint degenerative change. Tiny calcification, lateral aspect of humeral head suggesting mild tendinitis. IMPRESSION: 1. No acute osseous abnormality 2. Minimal AC joint degenerative change 3. Small amount of calcific tendinitis Electronically Signed   By: Donavan Foil M.D.   On: 03/10/2017 15:24   US Abdomen Complete  Result Date: 03/10/2017 CLINICAL DATA:  Jaundice. EXAM: ABDOMEN ULTRASOUND COMPLETE COMPARISON:  CT scan of November 17, 2016. Ultrasound of November 28, 2015. FINDINGS: Gallbladder: Status post cholecystectomy. Common bile duct: Diameter: 7 mm  which is within normal limits post cholecystectomy status. Liver: Heterogeneous echotexture of hepatic parenchyma is noted with nodular contours consistent with hepatic cirrhosis. No focal lesion is noted. Portal vein is patent on Doppler, although hepatofugal flow is noted. IVC: No abnormality visualized. Pancreas: Visualized portion unremarkable. Spleen: Maximum measured length of 13.7 cm is noted with calculated volume of 450 cubic cm. This is consistent with mild splenomegaly. Right Kidney: Length: 10.4 cm. Echogenicity within normal limits. No mass or hydronephrosis visualized. Left Kidney: Length: 9.4 cm. Multiple cysts are noted, with the largest measuring 3 cm. 1.7 cm nonobstructive calculus is noted in lower pole. Echogenicity within normal limits. No mass or hydronephrosis visualized. Abdominal aorta: No aneurysm visualized. Other findings: Mild ascites is noted. IMPRESSION: Findings consistent with hepatic cirrhosis. Splenomegaly is noted. Hepatofugal flow is noted in main portal vein consistent with portal hypertension. Mild ascites is noted. Nonobstructive left renal calculus is noted. No hydronephrosis or renal obstruction is noted. Electronically Signed   By: Marijo Conception, M.D.   On: 03/10/2017 15:22   US Paracentesis  Result Date: 03/10/2017 INDICATION: Primary biliary cirrhosis, ascites. Request made for diagnostic and therapeutic paracentesis. EXAM: ULTRASOUND GUIDED DIAGNOSTIC AND THERAPEUTIC PARACENTESIS MEDICATIONS: None. COMPLICATIONS: None immediate. PROCEDURE: Informed written consent was obtained from the patient after a discussion of the risks, benefits and alternatives to treatment. A timeout was performed prior to the initiation of the procedure. Initial ultrasound scanning demonstrates a moderate-to-large amount of ascites within the right mid to lower abdominal quadrant. The right mid to lower abdomen was prepped and draped in the usual sterile fashion. 1% lidocaine with  epinephrine was used for local anesthesia. Following this, a Yueh catheter was introduced. An ultrasound image was saved for documentation purposes. The paracentesis was performed. The catheter was removed and a dressing was applied. The patient tolerated the procedure well without immediate post procedural complication. FINDINGS: A total of approximately 3.8 liters of turbid, golden yellow fluid was removed. Samples were sent to the laboratory as requested by the clinical team. IMPRESSION: Successful ultrasound-guided diagnostic and therapeutic paracentesis yielding 3.8 liters of peritoneal fluid. Read by: Rowe Robert, PA-C Electronically Signed   By: Jerilynn Mages.  Shick M.D.   On: 03/10/2017 14:58      Problem List  Principal Problem:   Primary biliary cirrhosis (HCC) Active Problems:   Jaundice   Hyperbilirubinemia   Hypercalcemia   Dehydration   Ascites   Thrombocytopenia (HCC)   Acute lower UTI   Assessment: This is a 71 year old Caucasian female with what appears to be end-stage primary biliary cirrhosis who is now undergoing transplant evaluation at [facility] who presented with a worsening abdominal pain and jaundice.  She has had worsening abdominal distention as a result of ascites.  She appears to be dehydrated with elevated BUN and mildly elevated creatinine.  She has hypercalcemia and abnormal LFTs.  She also is noted to have thrombocytopenia.  Plan: #1 end-stage primary biliary cirrhosis with abnormal LFTs hyperbilirubinemia and with portal hypertension and ascites: Discussed with her gastroenterologist who recommends paracentesis.  This has been ordered.  Fluid will be sent for labs and cultures and cytology.  She will be given albumin post paracentesis.  Ultrasound of the abdomen also recommended by gastroenterology.  They will continue to follow the patient in the hospital.  Albumin is 1.9.  INR is 1.46.  Hold her diuretics for now.  #2  Moderate hypercalcemia: Corrected calcium  is 13.8.  Patient appears to be mentating normally at this time.  She is dehydrated.  She is noted to be on calcium supplements at home.  For now we will discontinue the supplements and give her IV fluids.  I will repeat her calcium level.  If there is no improvement then she may need to be given bisphosphonate.  #3 thrombocytopenia: This is a result of her cirrhosis.  Continue to monitor platelet counts.  #4  Urinary tract infection: She is noted to have an abnormal UA.  Await urine cultures.  Treat with ceftriaxone.  #5 dehydration: BUN is noted to be higher than her baseline.  Patient appears to be intravascularly depleted.  She will be given IV fluids.  #6 pain and limited range of motion of the right shoulder: Patient denies any falls or injuries.  X-ray has been ordered.  ADDENDUM Patient underwent paracentesis with removal of 3.8 L of turbid yellow fluid.  Cell count shows a total WBC of 7800 with 65% neutrophils.  This is concerning for spontaneous bacterial peritonitis.  Patient will be treated with ceftriaxone 2 g every 24 hours.  Cultures will be followed up on.  Ultrasound of the abdomen does not show any other acute findings.  Portal hypertension is noted.  X-ray of the right shoulder does not show any acute findings either.  DVT Prophylaxis: TED stockings Code Status: Full code Family Communication: Discussed with the patient and her son Consults called: [facility]  Severity of Illness: The appropriate patient status for this patient is INPATIENT. Inpatient status is judged to be reasonable and necessary in order to provide the required intensity of service to ensure the patient's safety. The patient's presenting symptoms, physical exam findings, and initial radiographic and laboratory data in the context of their chronic comorbidities is felt to place them at high risk for further clinical deterioration. Furthermore, it is not anticipated that the patient will be  medically stable for discharge from the hospital within 2 midnights of admission. The following factors support the patient status of inpatient.   " The patient's presenting symptoms include abdominal pain. " The worrisome physical exam findings include jaundice. " The initial radiographic and laboratory data are worrisome because of hyperbilirubinemia, hypercalcemia. " The chronic co-morbidities include primary biliary cirrhosis.   * I certify that at the  point of admission it is my clinical judgment that the patient will require inpatient hospital care spanning beyond 2 midnights from the point of admission due to high intensity of service, high risk for further deterioration and high frequency of surveillance required.*  Further management decisions will depend on results of further testing and patient's response to treatment.   Bonnielee Haff  [facility] Pager [phone]  If 7PM-7AM, please contact night-coverage [email or web address] Password [email or web address]  03/10/2017, 3:58 PM

## 2017-03-10 NOTE — ED Notes (Signed)
Patient transported to Ultrasound, then will go to x-ray after paracentesis.

## 2017-03-10 NOTE — ED Provider Notes (Signed)
[place] DEPT Provider Note   CSN: [id number] Arrival date & time: 03/10/17  0630     History   Chief Complaint Chief Complaint  Patient presents with  . Abdominal Pain  . Jaundice    HPI Sandra Hobbs is a 71 y.o. female.  HPI Patient presents to the emergency room for evaluation of weakness.  Patient has a history of primary biliary cirrhosis.  She had been stable for many years but recently started to get worse.  Her gastroenterologist referred her to [facility] for possible liver transplantation.  Last week as part of her evaluation she had an endoscopy and colonoscopy.  Patient feels like since then her symptoms have progressed rapidly.  She started noticing abdominal bloating.  Her skin started to look yellow.  Patient is feeling very weak.  Little cramping and discomfort.  She denies any fevers or vomiting.  She has not nausea but has very little appetite.  Her mouth feels dry and she feels dehydrated.  No significant diarrhea.  No rectal bleeding noted. Past Medical History:  Diagnosis Date  . ASCUS favor benign 09/2015   negative HR HPV  recommend repeat pap one year  . Cirrhosis, biliary ([place])    primary  . Fuchs' corneal dystrophy   . History of hiatal hernia   . Hx of colonoscopy   . Osteoporosis   . Raynaud disease   . Reynolds syndrome [facility])    patient denies states she has Raynaud's    There are no active problems to display for this patient.   Past Surgical History:  Procedure Laterality Date  . APPENDECTOMY    . CATARACT EXTRACTION    . CHOLECYSTECTOMY    . COLONOSCOPY    . CORNEAL TRANSPLANT    . eyelid surg    . KNEE SURGERY Right    twice  . REFRACTIVE SURGERY    . RETINAL DETACHMENT SURGERY    . TONSILLECTOMY    . TUBAL LIGATION      OB History    Gravida Para Term Preterm AB Living   2 2       2    SAB TAB Ectopic Multiple Live Births                   Home Medications    Prior to Admission medications    Medication Sig Start Date End Date Taking? Authorizing Provider  CALCIUM PO Take 2 tablets by mouth daily.   Yes [provider]  cholecalciferol (VITAMIN D) 1000 units tablet Take 1,000 Units by mouth daily.    Yes [provider]  furosemide (LASIX) 20 MG tablet Take 20 mg daily by mouth. 03/04/17  Yes [provider]  prednisoLONE acetate (PRED FORTE) 1 % ophthalmic suspension Place 1 drop into the right eye 2 (two) times daily.    Yes [provider]  sodium chloride (MURO 128) 2 % ophthalmic solution Place 1 drop into the left eye 2 (two) times daily.    Yes [provider]  spironolactone (ALDACTONE) 100 MG tablet Take 100 mg daily by mouth. 03/04/17  Yes [provider]  timolol (BETIMOL) 0.5 % ophthalmic solution Place 1 drop into the right eye 2 (two) times daily.    Yes [provider]  ursodiol (ACTIGALL) 300 MG capsule Take 300 mg by mouth 2 (two) times daily.   Yes [provider]  Probiotic Product (PROBIOTIC PO) Take by mouth.    [provider]    Family History Family History  Problem Relation Age of Onset  . Stroke Mother   . Heart disease Father   . Aneurysm Father   . Diabetes Brother   . Heart failure Paternal Grandfather     Social History Social History   Tobacco Use  . Smoking status: Never Smoker  . Smokeless tobacco: Never Used  Substance Use Topics  . Alcohol use: No    Alcohol/week: 0.0 oz  . Drug use: No     Allergies   Codeine; Valacyclovir; and Amoxicillin   Review of Systems Review of Systems  Constitutional: Positive for fatigue. Negative for fever.  HENT:       Dry mouth   Cardiovascular: Positive for leg swelling. Negative for chest pain.  Neurological: Positive for weakness.       Confusion  All other systems reviewed and are negative.    Physical Exam Updated Vital Signs BP 122/64 (BP Location: Left Arm)   Pulse 85   Temp (!) 97.3 F (36.3 C)  (Oral)   Resp (!) 24   SpO2 96%   Physical Exam  Constitutional: She appears ill. No distress.  HENT:  Head: Normocephalic and atraumatic.  Right Ear: External ear normal.  Left Ear: External ear normal.  Eyes: Conjunctivae are normal. Right eye exhibits no discharge. Left eye exhibits no discharge. Scleral icterus is present.  Neck: Neck supple. No tracheal deviation present.  Cardiovascular: Normal rate, regular rhythm and intact distal pulses.  Pulmonary/Chest: Effort normal and breath sounds normal. No stridor. No respiratory distress. She has no wheezes. She has no rales.  Abdominal: Soft. Bowel sounds are normal. She exhibits distension and ascites. She exhibits no mass. There is generalized tenderness. There is no rebound and no guarding.  Musculoskeletal: She exhibits no edema or tenderness.       Right shoulder: She exhibits swelling ( Bilateral pitting edema in the lower extremities).  Neurological: She is alert. She has normal strength. No cranial nerve deficit (no facial droop, extraocular movements intact, no slurred speech) or sensory deficit. She exhibits normal muscle tone. She displays no seizure activity. Coordination normal.  Skin: Skin is warm and dry. No rash noted.  Jaundiced skin color  Psychiatric: She has a normal mood and affect.  Nursing note and vitals reviewed.    ED Treatments / Results  Labs (all labs ordered are listed, but only abnormal results are displayed) Labs Reviewed  COMPREHENSIVE METABOLIC PANEL - Abnormal; Notable for the following components:      Result Value   CO2 20 (*)    BUN 47 (*)    Creatinine, Ser 1.48 (*)    Calcium 12.0 (*)    Total Protein 5.9 (*)    Albumin 1.9 (*)    AST 102 (*)    ALT 56 (*)    Alkaline Phosphatase 186 (*)    Total Bilirubin 15.7 (*)    GFR calc non Af Amer 35 (*)    GFR calc Af Amer 40 (*)    All other components within normal limits  CBC - Abnormal; Notable for the following components:   RDW 19.5  (*)    Platelets 93 (*)    All other components within normal limits  URINALYSIS, ROUTINE W REFLEX MICROSCOPIC - Abnormal; Notable for the following components:   Color, Urine AMBER (*)    APPearance CLOUDY (*)    Hgb urine dipstick LARGE (*)    Bilirubin Urine MODERATE (*)  Protein, ur 30 (*)    Leukocytes, UA LARGE (*)    Bacteria, UA MANY (*)    Squamous Epithelial / LPF 0-5 (*)    Non Squamous Epithelial 0-5 (*)    All other components within normal limits  PROTIME-INR - Abnormal; Notable for the following components:   Prothrombin Time 17.6 (*)    All other components within normal limits  AMMONIA - Abnormal; Notable for the following components:   Ammonia 39 (*)    All other components within normal limits  URINE CULTURE  LIPASE, BLOOD    EKG  EKG Interpretation None       Radiology No results found.  Procedures .Critical Care Performed by: Dorie Rank, MD Authorized by: Dorie Rank, MD   Critical care provider statement:    Critical care time (minutes):  35   Critical care was necessary to treat or prevent imminent or life-threatening deterioration of the following conditions:  Hepatic failure and metabolic crisis   Critical care was time spent personally by me on the following activities:  Discussions with consultants, evaluation of patient's response to treatment, examination of patient, ordering and performing treatments and interventions, ordering and review of laboratory studies, ordering and review of radiographic studies, pulse oximetry, re-evaluation of patient's condition, obtaining history from patient or surrogate and review of old charts   (including critical care time)  Medications Ordered in ED Medications  0.9 %  sodium chloride infusion (not administered)  sodium chloride 0.9 % bolus 500 mL (not administered)  cefTRIAXone (ROCEPHIN) 1 g in dextrose 5 % 50 mL IVPB (1 g Intravenous New Bag/Given 03/10/17 1213)  sodium chloride 0.9 % bolus 1,000  mL (1,000 mLs Intravenous New Bag/Given 03/10/17 1215)     Initial Impression / Assessment and Plan / ED Course  I have reviewed the triage vital signs and the nursing notes.  Pertinent labs & imaging results that were available during my care of the patient were reviewed by me and considered in my medical decision making (see chart for details).  Clinical Course as of Mar 10 1236  Thu Mar 10, 2017  1236 Discussed with [facility] GI on call.  Will consult on patient.  Anticipate EGD tomorrow.  NPO past midnight.    [JK]    Clinical Course User Index [JK] Dorie Rank, MD  Patient presented to the emergency room for increasing weakness.  Patient has a known history of primary biliary cirrhosis.  Unfortunately recently her symptoms have progressed rapidly.  She has actually been referred to the [facility] liver transplant clinic.  Patient's laboratory tests are notable for hypercalcemia, acute kidney injury, and significantly elevated bilirubin now up to 15.7.  Patient's urinalysis also suggestive of a UTI.  Plan on admission to hospital for IV fluids and antibiotics.  I will add on EKG considering her hypercalcemia.  I will consult with gastroenterology and the hospitalist.  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

## 2017-03-10 NOTE — Progress Notes (Addendum)
Sandra Hobbs 1:44 PM  Subjective: Patient with symptomatic ascites seen in the office today and sent to the ER for stat labs and urgent paracentesis and she had an uneventful colonoscopy and endoscopy a week ago which showed some portal gastropathy but no other significant findings and her BUN and a week ago was 28 and creatinine 1.3 and her Lasix was held for a while and we started Aldactone on Monday in addition to her Lasix but she is continued to have increased abdominal girth and discomfort despite increased urination and she has a follow-up at [facility] after Thanksgiving and no other new complaints Objective: Vital signs stable afebrile no acute distress obviously more jaundiced than a week ago increased abdominal girth soft however nontender easily reducible umbilical hernia 4+ pedal edema 2 days labs reviewed increased bili and BUN and creatinine and calcium other labs stable  Assessment: 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

## 2017-03-11 DIAGNOSIS — E860 Dehydration: Secondary | ICD-10-CM

## 2017-03-11 DIAGNOSIS — R17 Unspecified jaundice: Secondary | ICD-10-CM

## 2017-03-11 DIAGNOSIS — D696 Thrombocytopenia, unspecified: Secondary | ICD-10-CM

## 2017-03-11 DIAGNOSIS — R52 Pain, unspecified: Secondary | ICD-10-CM

## 2017-03-11 LAB — COMPREHENSIVE METABOLIC PANEL
ALBUMIN: 1.6 g/dL — AB (ref 3.5–5.0)
ALK PHOS: 132 U/L — AB (ref 38–126)
ALT: 44 U/L (ref 14–54)
AST: 79 U/L — ABNORMAL HIGH (ref 15–41)
Anion gap: 9 (ref 5–15)
BUN: 51 mg/dL — AB (ref 6–20)
CALCIUM: 11.1 mg/dL — AB (ref 8.9–10.3)
CHLORIDE: 110 mmol/L (ref 101–111)
CO2: 20 mmol/L — AB (ref 22–32)
CREATININE: 1.62 mg/dL — AB (ref 0.44–1.00)
GFR calc non Af Amer: 31 mL/min — ABNORMAL LOW (ref >60)
GFR, EST AFRICAN AMERICAN: 36 mL/min — AB (ref >60)
GLUCOSE: 56 mg/dL — AB (ref 65–99)
Potassium: 3.9 mmol/L (ref 3.5–5.1)
Sodium: 139 mmol/L (ref 135–145)
Total Bilirubin: 12.7 mg/dL — ABNORMAL HIGH (ref 0.3–1.2)
Total Protein: 4.7 g/dL — ABNORMAL LOW (ref 6.5–8.1)

## 2017-03-11 LAB — CBC WITH DIFFERENTIAL/PLATELET
BASOS PCT: 0 %
Basophils Absolute: 0 10*3/uL (ref 0.0–0.1)
EOS PCT: 0 %
Eosinophils Absolute: 0 10*3/uL (ref 0.0–0.7)
HCT: 36 % (ref 36.0–46.0)
HEMOGLOBIN: 12.4 g/dL (ref 12.0–15.0)
LYMPHS ABS: 1.2 10*3/uL (ref 0.7–4.0)
LYMPHS PCT: 8 %
MCH: 33.2 pg (ref 26.0–34.0)
MCHC: 34.4 g/dL (ref 30.0–36.0)
MCV: 96.3 fL (ref 78.0–100.0)
MONOS PCT: 6 %
Monocytes Absolute: 0.9 10*3/uL (ref 0.1–1.0)
NEUTROS ABS: 13 10*3/uL — AB (ref 1.7–7.7)
Neutrophils Relative %: 86 %
Platelets: 67 10*3/uL — ABNORMAL LOW (ref 150–400)
RBC: 3.74 MIL/uL — ABNORMAL LOW (ref 3.87–5.11)
RDW: 20.4 % — AB (ref 11.5–15.5)
WBC: 15.1 10*3/uL — ABNORMAL HIGH (ref 4.0–10.5)

## 2017-03-11 LAB — PROTIME-INR
INR: 2.23
Prothrombin Time: 24.5 seconds — ABNORMAL HIGH (ref 11.4–15.2)

## 2017-03-11 MED ORDER — LACTULOSE 10 GM/15ML PO SOLN
30.0000 g | Freq: Three times a day (TID) | ORAL | Status: DC
  Administered 2017-03-11 (×2): 30 g via ORAL
  Filled 2017-03-11 (×3): qty 45

## 2017-03-11 MED ORDER — ENSURE ENLIVE PO LIQD: 237.0000 mL | Freq: Two times a day (BID) | ORAL | Status: DC

## 2017-03-11 MED ORDER — MORPHINE SULFATE (PF) 4 MG/ML IV SOLN
1.0000 mg | Freq: Three times a day (TID) | INTRAVENOUS | Status: DC | PRN
  Administered 2017-03-11: 2 mg via INTRAVENOUS
  Filled 2017-03-11: qty 1

## 2017-03-11 MED ORDER — VITAMIN K1 10 MG/ML IJ SOLN
10.0000 mg | Freq: Every day | INTRAMUSCULAR | Status: AC
  Administered 2017-03-11 – 2017-03-13 (×3): 10 mg via SUBCUTANEOUS
  Filled 2017-03-11 (×3): qty 1

## 2017-03-11 NOTE — Progress Notes (Signed)
PROGRESS NOTE    Sandra Hobbs  PXT:[id number] DOB: September 28, 1945 DOA: 03/10/2017 PCP: Deland Pretty, MD    Brief Narrative:  Sandra Hobbs is a 71 y.o. female with a past medical history of primary biliary cirrhosis who is under the care of a gastroenterologist has been experiencing worsening abdominal discomfort and jaundice over the past many weeks.  She was referred to the transplant center at [facility] for evaluation.  She was seen there about a week ago when she underwent upper and lower endoscopies.  Plan is for her to be reevaluated after Thanksgiving.  In the meantime patient's abdominal pain has worsened.  She has experienced worsening distention of her abdomen.  The jaundice is also worsened.  The pain is 4 out of 10 in intensity in the abdomen without any precipitating aggravating or relieving factors.  She denies any fever or chills.  Some nausea but no vomiting.  No diarrhea.  The patient was recently taken off of diuretics and then resumed due to increase in abdominal girth.  The patient feels like she is extremely dry in her mouth.  She was seen at her gastroenterologist office today and was referred to the hospital for further evaluation and management.     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 -Gastroenterology consulted -Patient underwent paracentesis on 03/10/2017 -Paracentesis yielded 3.8 L of turbid yellow fluid with a cell count showing a total WBC of 7800 with 65% neutrophils -Patient was started on ceftriaxone 2 g every 24 hours - Cultures pending Gram stain showing gram-negative rods - Ultrasound of the abdomen showing portal hypertension - albumin was 1.9 patient  received albumin infusion at time of paracentesis - INR is 1.46  Moderate hypercalcemia - Corrected calcium is 13.8 at time of admission -Corrected calcium this morning is 13 -Patient appears drowsy this morning however she did just receive 1 dose IV morphine -Slight improvement in calcium this morning -Repeat CMP in a.m. -Consider treatment with bisphosphonate if calcium fails to improve  Thrombocytopenia - This is a result of her cirrhosis -Decrease in platelets this morning - Monitor for signs of bleeding -Repeat count in a.m.  Urinary tract infection - Urine culture showing greater than 100,000 colonies of E. coli -Treat with ceftriaxone  Dehydration - BUN is noted to be higher than her baseline -Was given IV fluids -We will evaluate if patient needs further IV fluid replacement   DVT prophylaxis: TED hose Code Status: Full code Family Communication: No family bedside Disposition Plan: Pending improvement   Consultants:   [facility]  Procedures:   Paracentesis on 03/10/2017  Antimicrobials:   Ceftriaxone   Subjective: Patient was seen on bedside rounds.  She appeared drowsy and was only answering a few questions.  Of note she had just received 1 dose of IV morphine.  Objective: Vitals:   03/10/17 1558 03/10/17 2115 03/11/17 0551 03/11/17 1127  BP: (!) 102/50 (!) 85/42 (!) 84/64 (!) 108/55  Pulse: 92 98 92 87  Resp: 18 18 16  (!) 24  Temp: 97.9 F (36.6 C) 98.5 F (36.9 C) 98.3 F (36.8 C) 97.7 F (36.5 C)  TempSrc: Oral Oral Oral Oral  SpO2: 100% 93% 95% 96%  Weight: 63.2  kg (139 lb 5.3 oz)     Height: 5\' 5"  (1.651 m)       Intake/Output Summary (Last 24 hours) at 03/11/2017 1446 Last data filed at 03/11/2017 0553 Gross per 24 hour  Intake 0 ml  Output 125 ml  Net -125 ml   Filed Weights   03/10/17 1558  Weight: 63.2 kg (139 lb 5.3 oz)    Examination:  General exam: Appears drowsy, chronically  ill-appearing Respiratory system: Clear to auscultation. Respiratory effort normal. Cardiovascular system: S1 & S2 heard, RRR. No JVD, murmurs, rubs, gallops or clicks. No pedal edema. Gastrointestinal system: Abdomen is distended with a midline umbilical hernia, nontender although patient was drowsy during exam soft bowel sounds Central nervous system: Drowsy, unable to evaluate with full neurological exam Extremities: Able to move arms and legs bilaterally and independently Skin: Jaundice Psychiatry: Unable to evaluate as patient is drowsy    Data Reviewed: I have personally reviewed following labs and imaging studies  CBC: Recent Labs  Lab 03/10/17 1103 03/11/17 0540  WBC 6.9 15.1*  NEUTROABS  --  13.0*  HGB 13.4 12.4  HCT 38.8 36.0  MCV 95.3 96.3  PLT 93* 67*   Basic Metabolic Panel: Recent Labs  Lab 03/10/17 1103 03/11/17 0540  NA 136 139  K 3.6 3.9  CL 103 110  CO2 20* 20*  GLUCOSE 81 56*  BUN 47* 51*  CREATININE 1.48* 1.62*  CALCIUM 12.0* 11.1*   GFR: Estimated Creatinine Clearance: 29.1 mL/min (A) (by C-G formula based on SCr of 1.62 mg/dL (H)). Liver Function Tests: Recent Labs  Lab 03/10/17 1103 03/11/17 0540  AST 102* 79*  ALT 56* 44  ALKPHOS 186* 132*  BILITOT 15.7* 12.7*  PROT 5.9* 4.7*  ALBUMIN 1.9* 1.6*   Recent Labs  Lab 03/10/17 1103  LIPASE 28   Recent Labs  Lab 03/10/17 1103  AMMONIA 39*   Coagulation Profile: Recent Labs  Lab 03/10/17 1103 03/11/17 0540  INR 1.46 2.23   Cardiac Enzymes: No results for input(s): CKTOTAL, CKMB, CKMBINDEX, TROPONINI in the last 168 hours. BNP (last 3 results) No results for input(s): PROBNP in the last 8760 hours. HbA1C: No results for input(s): HGBA1C in the last 72 hours. CBG: No results for input(s): GLUCAP in the last 168 hours. Lipid Profile: No results for input(s): CHOL, HDL, LDLCALC, TRIG, CHOLHDL, LDLDIRECT in the last 72 hours. Thyroid Function Tests: No results for input(s):  TSH, T4TOTAL, FREET4, T3FREE, THYROIDAB in the last 72 hours. Anemia Panel: No results for input(s): VITAMINB12, FOLATE, FERRITIN, TIBC, IRON, RETICCTPCT in the last 72 hours. Sepsis Labs: No results for input(s): PROCALCITON, LATICACIDVEN in the last 168 hours.  Recent Results (from the past 240 hour(s))  Urine Culture     Status: Abnormal (Preliminary result)   Collection Time: 03/10/17 10:40 AM  Result Value Ref Range Status   Specimen Description URINE, CLEAN CATCH  Final   Special Requests NONE  Final   Culture (A)  Final    >=100,000 COLONIES/mL ESCHERICHIA COLI SUSCEPTIBILITIES TO FOLLOW Performed at [facility], [address] [place], [place] [address]    Report Status PENDING  Incomplete  Culture, body fluid-bottle     Status: None (Preliminary result)   Collection Time: 03/10/17  2:28 PM  Result Value Ref Range Status   Specimen Description FLUID PERITONEAL ASCITIC  Final   Special Requests BOTTLES DRAWN AEROBIC AND ANAEROBIC  Final   Gram Stain PENDING  Incomplete   Culture  Final    NO GROWTH < 24 HOURS Performed at [facility], [place] [address] [place], [place] [address]    Report Status PENDING  Incomplete  Gram stain     Status: None   Collection Time: 03/10/17  2:28 PM  Result Value Ref Range Status   Specimen Description FLUID PERITONEAL ASCITIC  Final   Special Requests NONE  Final   Gram Stain   Final    ABUNDANT WBC PRESENT,BOTH PMN AND MONONUCLEAR ABUNDANT GRAM NEGATIVE RODS Performed at [facility], [address], [place], [place] [address]    Report Status 03/10/2017 FINAL  Final         Radiology Studies: Dg Shoulder Right  Result Date: 03/10/2017 CLINICAL DATA:  Pain in the right shoulder EXAM: RIGHT SHOULDER - 2+ VIEW COMPARISON:  None. FINDINGS: No fracture or malalignment. Mild AC joint degenerative change. Tiny calcification, lateral aspect of humeral head suggesting mild tendinitis. IMPRESSION: 1. No acute osseous  abnormality 2. Minimal AC joint degenerative change 3. Small amount of calcific tendinitis Electronically Signed   By: Donavan Foil M.D.   On: 03/10/2017 15:24   US Abdomen Complete  Result Date: 03/10/2017 CLINICAL DATA:  Jaundice. EXAM: ABDOMEN ULTRASOUND COMPLETE COMPARISON:  CT scan of November 17, 2016. Ultrasound of November 28, 2015. FINDINGS: Gallbladder: Status post cholecystectomy. Common bile duct: Diameter: 7 mm which is within normal limits post cholecystectomy status. Liver: Heterogeneous echotexture of hepatic parenchyma is noted with nodular contours consistent with hepatic cirrhosis. No focal lesion is noted. Portal vein is patent on Doppler, although hepatofugal flow is noted. IVC: No abnormality visualized. Pancreas: Visualized portion unremarkable. Spleen: Maximum measured length of 13.7 cm is noted with calculated volume of 450 cubic cm. This is consistent with mild splenomegaly. Right Kidney: Length: 10.4 cm. Echogenicity within normal limits. No mass or hydronephrosis visualized. Left Kidney: Length: 9.4 cm. Multiple cysts are noted, with the largest measuring 3 cm. 1.7 cm nonobstructive calculus is noted in lower pole. Echogenicity within normal limits. No mass or hydronephrosis visualized. Abdominal aorta: No aneurysm visualized. Other findings: Mild ascites is noted. IMPRESSION: Findings consistent with hepatic cirrhosis. Splenomegaly is noted. Hepatofugal flow is noted in main portal vein consistent with portal hypertension. Mild ascites is noted. Nonobstructive left renal calculus is noted. No hydronephrosis or renal obstruction is noted. Electronically Signed   By: Marijo Conception, M.D.   On: 03/10/2017 15:22   US Paracentesis  Result Date: 03/10/2017 INDICATION: Primary biliary cirrhosis, ascites. Request made for diagnostic and therapeutic paracentesis. EXAM: ULTRASOUND GUIDED DIAGNOSTIC AND THERAPEUTIC PARACENTESIS MEDICATIONS: None. COMPLICATIONS: None immediate. PROCEDURE:  Informed written consent was obtained from the patient after a discussion of the risks, benefits and alternatives to treatment. A timeout was performed prior to the initiation of the procedure. Initial ultrasound scanning demonstrates a moderate-to-large amount of ascites within the right mid to lower abdominal quadrant. The right mid to lower abdomen was prepped and draped in the usual sterile fashion. 1% lidocaine with epinephrine was used for local anesthesia. Following this, a Yueh catheter was introduced. An ultrasound image was saved for documentation purposes. The paracentesis was performed. The catheter was removed and a dressing was applied. The patient tolerated the procedure well without immediate post procedural complication. FINDINGS: A total of approximately 3.8 liters of turbid, golden yellow fluid was removed. Samples were sent to the laboratory as requested by the clinical team. IMPRESSION: Successful ultrasound-guided diagnostic and therapeutic paracentesis yielding 3.8 liters of  peritoneal fluid. Read by: Rowe Robert, PA-C Electronically Signed   By: Jerilynn Mages.  Shick M.D.   On: 03/10/2017 14:58        Scheduled Meds: . feeding supplement (ENSURE ENLIVE)  237 mL Oral BID BM  . lactulose  30 g Oral TID  . phytonadione  10 mg Subcutaneous Daily  . prednisoLONE acetate  1 drop Right Eye BID  . sodium chloride  1 drop Left Eye BID  . timolol  1 drop Right Eye BID  . ursodiol  300 mg Oral BID   Continuous Infusions: . cefTRIAXone (ROCEPHIN)  IV       LOS: 1 day    Time spent: 35 minutes    Loretha Stapler, MD [facility] Pager [phone]  If 7PM-7AM, please contact night-coverage [email or web address] Password [email or web address] 03/11/2017, 2:46 PM

## 2017-03-11 NOTE — Evaluation (Signed)
Physical Therapy Evaluation Patient Details Name: Sandra Hobbs MRN: [id number] DOB: [birth year] Today's Date: 03/11/2017   History of Present Illness  Sandra Hobbs is a 71 y.o. female with a past medical history of primary biliary cirrhosis who is under the care of a gastroenterologist has been experiencing worsening abdominal discomfort and jaundice over the past many weeks.  She was referred to the transplant center at [facility] for evaluation.  She was seen there about a week ago when she underwent upper and lower endoscopies.  Plan is for her to be reevaluated after Thanksgiving.  In the meantime patient's abdominal,  distention of her abdomen and jaundice have worsened, admited 11/15 and had urgent paracentesis yielding 3.8L of fluid  Clinical Impression  Pt admitted with above diagnosis. Pt currently with functional limitations due to the deficits listed below (see PT Problem List).  Pt will benefit from skilled PT to increase their independence and safety with mobility to allow discharge to the venue listed below.     Pt tolerated transfer to chair although with c/o dizziness on standing, BP noted to be low earlier this am (84/64), planned to check pt after transfer to chair but PT session interrupted by rounding team and MD--left pt in their care;  Activity limited by symptoms noted above as well as significant fatigue and incr WOB with activity; pt very cooperative and agreeable to attempting mobility; will follow in acute setting    Follow Up Recommendations Home health PT;Supervision/Assistance - 24 hour(vs SNF depending on progress)    Equipment Recommendations  Rolling walker with 5" wheels    Recommendations for Other Services       Precautions / Restrictions Precautions Precautions: Fall Restrictions Weight Bearing Restrictions: No      Mobility  Bed Mobility Overal bed mobility: Needs Assistance Bed Mobility: Rolling;Sidelying to Sit Rolling: Min  assist Sidelying to sit: Mod assist       General bed mobility comments: assist with LEs off bed and trunk to upright  Transfers Overall transfer level: Needs assistance Equipment used: Rolling walker (2 wheeled) Transfers: Sit to/from [facility] Sit to Stand: Mod assist Stand pivot transfers: Min assist;Mod assist       General transfer comment: assist to rise and transition to RW; sit to stand x3 with some c/o dizziness   Ambulation/Gait             General Gait Details: NT d/t dizziness  and incr WOB  Stairs            Wheelchair Mobility    Modified Rankin (Stroke Patients Only)       Balance Overall balance assessment: Needs assistance   Sitting balance-Leahy Scale: Fair       Standing balance-Leahy Scale: Poor Standing balance comment: reliant on UEs and external support                              Pertinent Vitals/Pain Pain Assessment: No/denies pain    Home Living Family/patient expects to be discharged to:: Private residence Living Arrangements: Spouse/significant other Available Help at Discharge: Family;Available 24 hours/day Type of Home: House Home Access: Stairs to enter   [employer] of Steps: 1 Home Layout: One level Home Equipment: None Additional Comments: pt and husband have Jay out of there home    Prior Function                 Hand  Dominance        Extremity/Trunk Assessment   Upper Extremity Assessment Upper Extremity Assessment: Generalized weakness;Defer to OT evaluation    Lower Extremity Assessment Lower Extremity Assessment: Generalized weakness       Communication   Communication: No difficulties  Cognition Arousal/Alertness: Awake/alert Behavior During Therapy: WFL for tasks assessed/performed Overall Cognitive Status: Impaired/Different from baseline Area of Impairment: Following commands                       Following  Commands: Follows one step commands with increased time;Follows multi-step commands inconsistently       General Comments: pt verbalizes but only minimally; oriented to "cone" and the month does not know the day      General Comments      Exercises     Assessment/Plan    PT Assessment Patient needs continued PT services  PT Problem List Decreased strength;Decreased activity tolerance;Decreased mobility;Cardiopulmonary status limiting activity;Decreased balance;Decreased knowledge of use of DME       PT Treatment Interventions DME instruction;Gait training;Functional mobility training;Therapeutic activities;Therapeutic exercise;Patient/family education    PT Goals (Current goals can be found in the Care Plan section)  Acute Rehab PT Goals Patient Stated Goal: none stated PT Goal Formulation: With patient Time For Goal Achievement: 03/25/17 Potential to Achieve Goals: Fair    Frequency Min 3X/week   Barriers to discharge        Co-evaluation               AM-PAC PT "6 Clicks" Daily Activity  Outcome Measure Difficulty turning over in bed (including adjusting bedclothes, sheets and blankets)?: Unable Difficulty moving from lying on back to sitting on the side of the bed? : Unable Difficulty sitting down on and standing up from a chair with arms (e.g., wheelchair, bedside commode, etc,.)?: Unable Help needed moving to and from a bed to chair (including a wheelchair)?: A Little Help needed walking in hospital room?: A Little Help needed climbing 3-5 steps with a railing? : A Lot 6 Click Score: 11    End of Session Equipment Utilized During Treatment: Gait belt Activity Tolerance: Patient limited by fatigue;Treatment limited secondary to medical complications (Comment) Patient left: in chair;with call bell/phone within reach;Other (comment);with nursing/sitter in room(rounding team)   PT Visit Diagnosis: Unsteadiness on feet (R26.81)    Time: 7124-5809 PT Time  Calculation (min) (ACUTE ONLY): 23 min   Charges:   PT Evaluation $PT Eval Moderate Complexity: 1 Mod PT Treatments $Therapeutic Activity: 8-22 mins   PT G Codes:          Sandra Hobbs 29-Mar-2017, 11:32 AM

## 2017-03-11 NOTE — Care Management Note (Signed)
Case Management Note  Patient Details  Name: Sandra Hobbs MRN: [id number] Date of Birth: 11/10/1945  Subjective/Objective:                  Biliary cirrhosis and abd pain  Action/Plan: Date: March 11, 2017 Velva Harman, BSN, [place], [place] Chart and notes review for patient progress and needs. Will follow for case management and discharge needs. Next review date: [id number]  Expected Discharge Date:  (unknown)               Expected Discharge Plan:  Home/Self Care  In-House Referral:     Discharge planning Services  CM Consult  Post Acute Care Choice:    Choice offered to:     DME Arranged:    DME Agency:     HH Arranged:    HH Agency:     Status of Service:  In process, will continue to follow  If discussed at Long Length of Stay Meetings, dates discussed:    Additional Comments:  Leeroy Cha, RN 03/11/2017, 8:07 AM

## 2017-03-11 NOTE — Progress Notes (Signed)
OT Cancellation Note  Patient Details Name: NEMA OATLEY MRN: [id number] DOB: 1945/12/27   Cancelled Treatment:    Reason Eval/Treat Not Completed: Other (comment).  Spoke to [employer]. Pt is sleeping and he requests we try another day. Will try to return over the weekend.  Shelaine Frie 03/11/2017, 2:25 PM  Lesle Chris, OTR/L [phone] 03/11/2017

## 2017-03-11 NOTE — Progress Notes (Signed)
DAWNN NAM 9:33 AM  Subjective: Patient without any specific complaints however is lethargic and does have some slurred speech and does not answer all questions appropriately and her case discussed with her son at bedside and I tried to call her transplant coordinator at [facility] [phone] but had to leave a message  Objective: Vital signs stable afebrile no acute distress patient very fidgety having trouble with down abdomen still with obvious ascites soft nontender umbilical hernia easily reduced bili and other liver tests decreased white count increased INR increased increased BUN and creatinine fluid with increased white cells and positive Gram stain for gram-negative rods transudate ultrasound okay without obvious biliary or kidney obstruction  Assessment: PBC now with SBP and probable encephalopathy  Plan: Will begin lactulose and vitamin K continue to follow labs hopefully I can bring the [facility] team up-to-date today on the phone continue antibiotics await final culture and cytology and try to minimize pain medicines if possible and I went ahead and lowered the frequency of her when necessary morphine but possibly we can stop it altogether and my partner to see this weekend  [facility] E  Pager [phone] After 5PM or if no answer call [phone]

## 2017-03-11 NOTE — Progress Notes (Signed)
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  GOAL:   Patient will meet greater than or equal to 90% of their needs  MONITOR:   PO intake, Supplement acceptance, Weight trends, Labs  REASON FOR ASSESSMENT:   Consult Assessment of nutrition requirement/status  ASSESSMENT:   Pt with PMH significant for biliary cirrhosis and osteoporosis. Recently seen at [facility] for transplant evaluation a week ago where she underwent upper and lower endoscopies. Plan was to reevaluate after Thanksgiving but abdominal pain persisted. Presents this admission with worsening abdominal discomfort and jaundice over the last few weeks. 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   Spoke with pt at bedside who seems to be confused.  Family reports pt had decreased PO intake for two months prior to admission. She would typically consume a larger breakfast and pick throughout the rest of the day.  Pt does not consume supplementation at home. Amendable this hospital stay. Records indicate pt has gained weight since last visit in August 2018. This is most likely fluid related. Pt and family unable to provide UBW. Suspect pt has lost dry weight but unable to quantify given lack of information.  Nutrition-Focused physical exam completed. Pt shows to have severe bilateral lower extremity and abdominal edema. This could be masking muscle losses and weight loss. Plan for possible thoracentesis per MD notes.   Medications reviewed  and include: lactulose, Vit K, NS @ 100 ml/hr, IV abx Labs reviewed: ALP 132 (H) AST 79 (H) Ammonia 39 (H) CBG 56-81  NUTRITION - FOCUSED PHYSICAL EXAM:    Most Recent Value  Orbital Region  Moderate depletion  Upper Arm Region  Moderate depletion  Thoracic and Lumbar Region  Unable to assess  Buccal Region  Moderate depletion  Temple Region  Moderate depletion  Clavicle Bone Region  Moderate depletion  Clavicle and Acromion Bone Region  Moderate depletion  Scapular Bone Region  Unable to assess  Dorsal Hand  Moderate depletion  Patellar Region  No depletion  Anterior Thigh Region  No depletion  Posterior Calf Region  No depletion  Edema (RD Assessment)  Severe  Hair  Reviewed  Eyes  Reviewed  Mouth  Reviewed  Skin  Reviewed [jaundice]  Nails  Reviewed       Diet Order:  Diet full liquid Room service appropriate? Yes; Fluid consistency: Thin  EDUCATION NEEDS:   Education needs have been addressed  Skin:  Skin Assessment: Reviewed RN Assessment  Last BM:  03/10/17  Height:   Ht Readings from Last 1 Encounters:  03/10/17 5\' 5"  (1.651 m)    Weight:   Wt Readings from Last 1 Encounters:  03/10/17 139 lb 5.3 oz (63.2 kg)    Ideal Body Weight:  56.8 kg  BMI:  Body mass index is 23.19 kg/m.  Estimated Nutritional Needs:   Kcal:  1700-1900 kcal/day  Protein:  85-95 g/day  Fluid:  1.7 L/day    Mariana Single RD, LDN Clinical Nutrition Pager # - [phone]

## 2017-03-12 DIAGNOSIS — K743 Primary biliary cirrhosis: Secondary | ICD-10-CM

## 2017-03-12 DIAGNOSIS — Z515 Encounter for palliative care: Secondary | ICD-10-CM

## 2017-03-12 LAB — COMPREHENSIVE METABOLIC PANEL
ALBUMIN: 1.7 g/dL — AB (ref 3.5–5.0)
ALT: 56 U/L — AB (ref 14–54)
ALT: 64 U/L — ABNORMAL HIGH (ref 14–54)
ANION GAP: 10 (ref 5–15)
AST: 140 U/L — ABNORMAL HIGH (ref 15–41)
AST: 160 U/L — ABNORMAL HIGH (ref 15–41)
Albumin: 1.8 g/dL — ABNORMAL LOW (ref 3.5–5.0)
Alkaline Phosphatase: 192 U/L — ABNORMAL HIGH (ref 38–126)
Alkaline Phosphatase: 183 U/L — ABNORMAL HIGH (ref 38–126)
Anion gap: 11 (ref 5–15)
BILIRUBIN TOTAL: 17 mg/dL — AB (ref 0.3–1.2)
BUN: 79 mg/dL — ABNORMAL HIGH (ref 6–20)
BUN: 79 mg/dL — ABNORMAL HIGH (ref 6–20)
CALCIUM: 11.4 mg/dL — AB (ref 8.9–10.3)
CHLORIDE: 109 mmol/L (ref 101–111)
CHLORIDE: 109 mmol/L (ref 101–111)
CO2: 21 mmol/L — ABNORMAL LOW (ref 22–32)
CO2: 21 mmol/L — AB (ref 22–32)
CREATININE: 1.96 mg/dL — AB (ref 0.44–1.00)
Calcium: 11.4 mg/dL — ABNORMAL HIGH (ref 8.9–10.3)
Creatinine, Ser: 2.13 mg/dL — ABNORMAL HIGH (ref 0.44–1.00)
GFR calc non Af Amer: 22 mL/min — ABNORMAL LOW (ref >60)
GFR, EST AFRICAN AMERICAN: 29 mL/min — AB (ref >60)
GFR, EST AFRICAN AMERICAN: 26 mL/min — AB (ref >60)
GFR, EST NON AFRICAN AMERICAN: 25 mL/min — AB (ref >60)
GLUCOSE: 86 mg/dL (ref 65–99)
Glucose, Bld: 79 mg/dL (ref 65–99)
POTASSIUM: 3.8 mmol/L (ref 3.5–5.1)
POTASSIUM: 4.1 mmol/L (ref 3.5–5.1)
SODIUM: 140 mmol/L (ref 135–145)
Sodium: 141 mmol/L (ref 135–145)
TOTAL PROTEIN: 5.4 g/dL — AB (ref 6.5–8.1)
TOTAL PROTEIN: 5.3 g/dL — AB (ref 6.5–8.1)
Total Bilirubin: 17.8 mg/dL — ABNORMAL HIGH (ref 0.3–1.2)

## 2017-03-12 LAB — CBC WITH DIFFERENTIAL/PLATELET
BASOS ABS: 0.2 10*3/uL — AB (ref 0.0–0.1)
Basophils Relative: 1 %
EOS ABS: 0 10*3/uL (ref 0.0–0.7)
EOS PCT: 0 %
HCT: 42.6 % (ref 36.0–46.0)
Hemoglobin: 14.7 g/dL (ref 12.0–15.0)
LYMPHS ABS: 1.4 10*3/uL (ref 0.7–4.0)
Lymphocytes Relative: 7 %
MCH: 33.6 pg (ref 26.0–34.0)
MCHC: 34.5 g/dL (ref 30.0–36.0)
MCV: 97.3 fL (ref 78.0–100.0)
MONO ABS: 1 10*3/uL (ref 0.1–1.0)
Monocytes Relative: 5 %
NEUTROS PCT: 87 %
Neutro Abs: 17.7 10*3/uL — ABNORMAL HIGH (ref 1.7–7.7)
PLATELETS: 84 10*3/uL — AB (ref 150–400)
RBC: 4.38 MIL/uL (ref 3.87–5.11)
RDW: 20.6 % — AB (ref 11.5–15.5)
WBC: 20.3 10*3/uL — AB (ref 4.0–10.5)

## 2017-03-12 LAB — PROTIME-INR
INR: 1.99
PROTHROMBIN TIME: 22.4 s — AB (ref 11.4–15.2)

## 2017-03-12 MED ORDER — ALBUMIN HUMAN 25 % IV SOLN
100.0000 g | Freq: Once | INTRAVENOUS | Status: AC
  Administered 2017-03-12: 100 g via INTRAVENOUS
  Filled 2017-03-12 (×2): qty 400

## 2017-03-12 MED ORDER — LACTULOSE ENEMA
300.0000 mL | Freq: Once | ORAL | Status: AC
  Administered 2017-03-12: 300 mL via RECTAL
  Filled 2017-03-12: qty 300

## 2017-03-12 MED ORDER — PHENYLEPHRINE HCL-NACL 10-0.9 MG/250ML-% IV SOLN
0.0000 ug/min | INTRAVENOUS | Status: DC
  Administered 2017-03-12: 20 ug/min via INTRAVENOUS
  Administered 2017-03-13: 100 ug/min via INTRAVENOUS
  Filled 2017-03-12 (×4): qty 250

## 2017-03-12 MED ORDER — ALBUTEROL SULFATE (2.5 MG/3ML) 0.083% IN NEBU: 2.5000 mg | INHALATION_SOLUTION | RESPIRATORY_TRACT | Status: DC | PRN

## 2017-03-12 MED ORDER — FUROSEMIDE 10 MG/ML IJ SOLN
20.0000 mg | Freq: Once | INTRAMUSCULAR | Status: AC
  Administered 2017-03-12: 20 mg via INTRAVENOUS
  Filled 2017-03-12: qty 2

## 2017-03-12 MED ORDER — PANTOPRAZOLE SODIUM 40 MG IV SOLR
40.0000 mg | Freq: Every day | INTRAVENOUS | Status: DC
  Administered 2017-03-12 – 2017-03-13 (×2): 40 mg via INTRAVENOUS
  Filled 2017-03-12 (×2): qty 40

## 2017-03-12 MED ORDER — LEVOFLOXACIN IN D5W 750 MG/150ML IV SOLN
750.0000 mg | INTRAVENOUS | Status: DC
  Administered 2017-03-12: 750 mg via INTRAVENOUS
  Filled 2017-03-12: qty 150

## 2017-03-12 NOTE — Progress Notes (Signed)
Patient continues to be tachypnic and obtunded. RN concerned for patient decline. MD called to ask about a PCCM consult and to express concerns. MD discussing plan of care with family and palliative care NP.

## 2017-03-12 NOTE — Progress Notes (Signed)
Pharmacy Antibiotic Note  Sandra Hobbs is a 71 y.o. female admitted on 03/10/2017 with UTI and SBP.  Patient is currently on Rocephin & clinically deteriorating.  Urine cx now also + >100K enterococcus which is not covered by Rocephin.  Discussed options with Dr [name] & based on available cx sensitivities recommended Levaquin for coverage of SBP + UTI.  Pharmacy consulted to dose Levaquin for renal function.  03/12/2017:  Tm 97.42F  WBC increasing- 20.3 today  Scr increasing- 2.13 today (estimated CrCl ~109ml/min)  Plan: Levaquin 750mg  IV q48h Monitor renal function & culture data.   Height: 5\' 5"  (165.1 cm) Weight: 139 lb 5.3 oz (63.2 kg) IBW/kg (Calculated) : 57  Temp (24hrs), Avg:97.5 F (36.4 C), Min:97.3 F (36.3 C), Max:97.6 F (36.4 C)  Recent Labs  Lab 03/10/17 1103 03/11/17 0540 03/12/17 0551 03/12/17 1039  WBC 6.9 15.1* 20.3*  --   CREATININE 1.48* 1.62* 1.96* 2.13*    Estimated Creatinine Clearance: 22.1 mL/min (A) (by C-G formula based on SCr of 2.13 mg/dL (H)).    Allergies  Allergen Reactions  . Codeine Other (See Comments)    Hallucinate  . Valacyclovir Nausea And Vomiting  . Amoxicillin Rash    Antimicrobials this admission:  11/15 Rocephin >>11/17 11/17 Levaquin>>  Microbiology results:  11/15 UCx: >100K Ecoli (sens CTX, FQ, R=amp, zosyn), Enterococcus faecalis- sens pending 11/15 Ascitic fluid: abundant GNR  Thank you for allowing pharmacy to be a part of this patient's care.  Biagio Borg 03/12/2017 7:44 PM

## 2017-03-12 NOTE — Progress Notes (Signed)
Patient Demographics:    Sandra Hobbs, is a 71 y.o. female, DOB - 1945/04/29, RAQ:[id number]  Admit date - 03/10/2017   Admitting Physician Bonnielee Haff, MD  Outpatient Primary MD for the patient is Deland Pretty, MD  LOS - 2   Chief Complaint  Patient presents with  . Abdominal Pain  . 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 Watt Climes is her gastroenterologist.  Chief Complaint: Worsening abdominal pain and jaundice  HPI: Sandra Hobbs is a 71 y.o. female with a past medical history of primary biliary cirrhosis who is under the care of a gastroenterologist has been experiencing worsening abdominal discomfort and jaundice over the past many weeks.  She was referred to the transplant center at [facility] for evaluation.  She was seen there about a week ago when she underwent upper and lower endoscopies.  Plan is for her to be reevaluated after Thanksgiving.  In the meantime patient's abdominal pain has worsened.  She has experienced worsening distention of her abdomen.  The jaundice is also worsened.  The pain is 4 out of 10 in intensity in the abdomen without any precipitating aggravating or relieving factors.  She denies any fever or chills.  Some nausea but no vomiting.  No diarrhea.  The patient was recently taken off of diuretics and then resumed due to increase in abdominal girth.  The patient feels like she is extremely dry in her  mouth.  She was seen at her gastroenterologist office today and was referred to the hospital for further evaluation and management.   Plan:-  1)Primary Biliary Cirrhosis-  decompensated cirrhosis from  PBC, MELD score 31 complicated by SBP as well as acute kidney injury and hepatic encephalopathy.  Discussed with gastroenterologist Dr Haze Justin. Dr Watt Climes previously d/w Dr Angela Adam at [facility]. Given further decompensation and concerns about ability to protect airway.... Pt Transferred to ICU 1225 (Dr Oletta Darter at E link Notified) C/n Iv Albulmin infusions, rectal lactulose as tolerated  2)Spontaneous Bacterial Peritonitis-  SBP based on paracentesis 03/10/2017, WBC is now over 20,000, patient is more lethargic. She was on iv Rocephin 2 gm q 24 hrs since 03/10/17, last dose on 03/12/17, start Levaquin iv on 03/12/17 (pharmacy to adjust for renal function) as Rocephin would not cover Enterococcus faecalis in the urine culture from 03/10/2017 but Levaquin will  3)Hepatic encephalopathy-due to #1 above, mental status has declined, pt may need to be intubated for airway protection.  Patient would definitely need to be intubated should the need to transfer to [facility] hospital arise  4) Acute kidney injury-creatinine is up to 2.1 from 1.4, query hepatorenal syndrome, continue to avoid nephrotoxic agents, IV albumin as above  5)E coli and Enterococcus faecalis  UTI- WBC is now over 20,000, patient is more lethargic ,  was on iv Rocephin 2 gm q 24 hrs since 03/10/17, last dose on 03/12/17, start Levaquin iv on 03/12/17 (pharmacy to adjust for renal function) as Rocephin would not cover Enterococcus faecalis in the urine culture from 03/10/2017 but Levaquin will  6)FEN- NPO due to aspiration concerns in the setting of decreased responsiveness, IV albumin as ordered by GI team  7)Disposition-patient has been accepted by Dr Wardell Honour at [facility] ICU, for further evaluation by GI and transplant team, however no bed  available at this time at [facility] ICU. Patient would definitely need to be intubated should the need to transfer to [facility] hospital arise  8)Social/Ethics- palliative care conference with daughter, son and husband, they are requesting Full code status, no Limitation to treatment at this time. Family ok with possible transfer to [facility] ICU, they are ok with intubation for airway protection for transport to [facility] if bed becomes available at [facility]  Code Status : Full code   Disposition Plan  : Possible transfer to [facility] ICU, patient will need to be intubated prior to transfer to [facility] ICU, Dr Berline Lopes accepting  Consults  :  Critical care, GI and palliative care   DVT Prophylaxis  :   SCDs   Lab Results  Component Value Date   PLT 84 (L) 03/12/2017    Inpatient Medications  Scheduled Meds: . feeding supplement (ENSURE ENLIVE)  237 mL Oral BID BM  . lactulose  30 g Oral TID  . pantoprazole (PROTONIX) IV  40 mg Intravenous Daily  . phytonadione  10 mg Subcutaneous Daily  . prednisoLONE acetate  1 drop Right Eye BID  . sodium chloride  1 drop Left Eye BID  . timolol  1 drop Right Eye BID  . ursodiol  300 mg Oral BID   Continuous Infusions: . cefTRIAXone (ROCEPHIN)  IV 2 g (03/12/17 1837)   PRN Meds:.acetaminophen **OR** acetaminophen, albuterol, ondansetron **OR** ondansetron (ZOFRAN) IV    Anti-infectives (From admission, onward)   Start     Dose/Rate Route Frequency Ordered Stop   03/11/17 1800  cefTRIAXone (ROCEPHIN) 2 g in dextrose 5 % 50 mL IVPB     2 g 100 mL/hr over 30 Minutes Intravenous Every 24 hours 03/10/17 1648     03/11/17 1300  cefTRIAXone (ROCEPHIN) 1 g in dextrose 5 % 50 mL IVPB  Status:  Discontinued     1 g 100 mL/hr over 30 Minutes Intravenous Every 24 hours 03/10/17 1352 03/10/17 1648   03/10/17 1700  cefTRIAXone (ROCEPHIN) 1 g in dextrose 5 % 50 mL IVPB     1 g 100 mL/hr over 30 Minutes Intravenous  Once 03/10/17 1648 03/10/17 1759   03/10/17 1200   cefTRIAXone (ROCEPHIN) 1 g in dextrose 5 % 50 mL IVPB     1 g 100 mL/hr over 30 Minutes Intravenous  Once 03/10/17 1149 03/10/17 1252        Objective:   Vitals:   03/12/17 1615 03/12/17 1700 03/12/17 1800 03/12/17 1900  BP: (!) 154/104 (!) 99/47 (!) 123/40 (!) 92/32  Pulse:      Resp: 18 20 (!) 25 (!) 30  Temp:      TempSrc:      SpO2: 91% 99% 96% 98%  Weight:      Height:        Wt Readings from Last 3 Encounters:  03/10/17 63.2 kg (139 lb 5.3 oz)  12/14/16  57.3 kg (126 lb 4 oz)  10/05/16 56.2 kg (124 lb)     Intake/Output Summary (Last 24 hours) at 03/12/2017 1911 Last data filed at 03/12/2017 1800 Gross per 24 hour  Intake 0 ml  Output 450 ml  Net -450 ml     Physical Exam  Gen:-Lethargic HEENT:- [place].AT, sclera icterus Neck-Supple Neck, Lungs-diminished in bases, no wheezing CV- S1, S2 normal, HR 114 Abd-diminished bowel sounds, abdomen is distended, patient is too lethargic to elicit significant tenderness Extremity/Skin:-Significant jaundice Neuro-lethargic,  responds to noxious stimuli, occasionally will respond to verbal and tactile stimuli Psych- unable to assess due to significant encephalopathy   Data Review:   Micro Results Recent Results (from the past 240 hour(s))  Urine Culture     Status: Abnormal (Preliminary result)   Collection Time: 03/10/17 10:40 AM  Result Value Ref Range Status   Specimen Description URINE, CLEAN CATCH  Final   Special Requests NONE  Final   Culture (A)  Final    >=100,000 COLONIES/mL ESCHERICHIA COLI >=100,000 COLONIES/mL ENTEROCOCCUS FAECALIS    Report Status PENDING  Incomplete   Organism ID, Bacteria ESCHERICHIA COLI (A)  Final      Susceptibility   Escherichia coli - MIC*    AMPICILLIN >=32 RESISTANT Resistant     CEFAZOLIN 16 SENSITIVE Sensitive     CEFTRIAXONE <=1 SENSITIVE Sensitive     CIPROFLOXACIN 0.5 SENSITIVE Sensitive     GENTAMICIN <=1 SENSITIVE Sensitive     IMIPENEM <=0.25 SENSITIVE Sensitive      NITROFURANTOIN 64 INTERMEDIATE Intermediate     TRIMETH/SULFA <=20 SENSITIVE Sensitive     AMPICILLIN/SULBACTAM >=32 RESISTANT Resistant     PIP/TAZO 64 INTERMEDIATE Intermediate     Extended ESBL NEGATIVE Sensitive     * >=100,000 COLONIES/mL ESCHERICHIA COLI  Culture, body fluid-bottle     Status: None (Preliminary result)   Collection Time: 03/10/17  2:28 PM  Result Value Ref Range Status   Specimen Description FLUID PERITONEAL ASCITIC  Final   Special Requests BOTTLES DRAWN AEROBIC AND ANAEROBIC  Final   Culture   Final    CULTURE REINCUBATED FOR BETTER GROWTH Performed at [facility], [address], [place], [place] [address]    Report Status PENDING  Incomplete  Gram stain     Status: None   Collection Time: 03/10/17  2:28 PM  Result Value Ref Range Status   Specimen Description FLUID PERITONEAL ASCITIC  Final   Special Requests NONE  Final   Gram Stain   Final    ABUNDANT WBC PRESENT,BOTH PMN AND MONONUCLEAR ABUNDANT GRAM NEGATIVE RODS Performed at [facility], [address], [place], [place] [address]    Report Status 03/10/2017 FINAL  Final    Radiology Reports Dg Shoulder Right  Result Date: 03/10/2017 CLINICAL DATA:  Pain in the right shoulder EXAM: RIGHT SHOULDER - 2+ VIEW COMPARISON:  None. FINDINGS: No fracture or malalignment. Mild AC joint degenerative change. Tiny calcification, lateral aspect of humeral head suggesting mild tendinitis. IMPRESSION: 1. No acute osseous abnormality 2. Minimal AC joint degenerative change 3. Small amount of calcific tendinitis Electronically Signed   By: Donavan Foil M.D.   On: 03/10/2017 15:24   US Abdomen Complete  Result Date: 03/10/2017 CLINICAL DATA:  Jaundice. EXAM: ABDOMEN ULTRASOUND COMPLETE COMPARISON:  CT scan of November 17, 2016. Ultrasound of November 28, 2015. FINDINGS: Gallbladder: Status post cholecystectomy. Common bile duct: Diameter: 7 mm which is within normal limits post cholecystectomy status.  Liver: Heterogeneous echotexture of hepatic parenchyma is noted with nodular contours consistent with hepatic cirrhosis. No focal lesion is noted. Portal vein is patent on Doppler, although hepatofugal flow is noted. IVC: No abnormality visualized. Pancreas: Visualized portion unremarkable. Spleen: Maximum measured length of 13.7 cm is noted with calculated volume of 450 cubic cm. This is consistent with mild splenomegaly. Right Kidney: Length: 10.4 cm. Echogenicity within normal limits. No mass or hydronephrosis visualized. Left Kidney: Length: 9.4 cm. Multiple cysts are noted, with the largest measuring 3 cm. 1.7 cm nonobstructive calculus is noted in lower pole. Echogenicity within normal limits. No mass or hydronephrosis visualized. Abdominal aorta: No aneurysm visualized. Other findings: Mild ascites is noted. IMPRESSION: Findings consistent with hepatic cirrhosis. Splenomegaly is noted. Hepatofugal flow is noted in main portal vein consistent with portal hypertension. Mild ascites is noted. Nonobstructive left renal calculus is noted. No hydronephrosis or renal obstruction is noted. Electronically Signed   By: Marijo Conception, M.D.   On: 03/10/2017 15:22   US Paracentesis  Result Date: 03/10/2017 INDICATION: Primary biliary cirrhosis, ascites. Request made for diagnostic and therapeutic paracentesis. EXAM: ULTRASOUND GUIDED DIAGNOSTIC AND THERAPEUTIC PARACENTESIS MEDICATIONS: None. COMPLICATIONS: None immediate. PROCEDURE: Informed written consent was obtained from the patient after a discussion of the risks, benefits and alternatives to treatment. A timeout was performed prior to the initiation of the procedure. Initial ultrasound scanning demonstrates a moderate-to-large amount of ascites within the right mid to lower abdominal quadrant. The right mid to lower abdomen was prepped and draped in the usual sterile fashion. 1% lidocaine with epinephrine was used for local anesthesia. Following this, a Yueh  catheter was introduced. An ultrasound image was saved for documentation purposes. The paracentesis was performed. The catheter was removed and a dressing was applied. The patient tolerated the procedure well without immediate post procedural complication. FINDINGS: A total of approximately 3.8 liters of turbid, golden yellow fluid was removed. Samples were sent to the laboratory as requested by the clinical team. IMPRESSION: Successful ultrasound-guided diagnostic and therapeutic paracentesis yielding 3.8 liters of peritoneal fluid. Read by: Rowe Robert, PA-C Electronically Signed   By: Jerilynn Mages.  Shick M.D.   On: 03/10/2017 14:58     CBC Recent Labs  Lab 03/10/17 1103 03/11/17 0540 03/12/17 0551  WBC 6.9 15.1* 20.3*  HGB 13.4 12.4 14.7  HCT 38.8 36.0 42.6  PLT 93* 67* 84*  MCV 95.3 96.3 97.3  MCH 32.9 33.2 33.6  MCHC 34.5 34.4 34.5  RDW 19.5* 20.4* 20.6*  LYMPHSABS  --  1.2 1.4  MONOABS  --  0.9 1.0  EOSABS  --  0.0 0.0  BASOSABS  --  0.0 0.2*    Chemistries  Recent Labs  Lab 03/10/17 1103 03/11/17 0540 03/12/17 0551 03/12/17 1039  NA 136 139 141 140  K 3.6 3.9 3.8 4.1  CL 103 110 109 109  CO2 20* 20* 21* 21*  GLUCOSE 81 56* 79 86  BUN 47* 51* 79* 79*  CREATININE 1.48* 1.62* 1.96* 2.13*  CALCIUM 12.0* 11.1* 11.4* 11.4*  AST 102* 79* 140* 160*  ALT 56* 44 56* 64*  ALKPHOS 186* 132* 192* 183*  BILITOT 15.7* 12.7* 17.0* 17.8*   ------------------------------------------------------------------------------------------------------------------ No results for input(s): CHOL, HDL, LDLCALC, TRIG, CHOLHDL, LDLDIRECT in the last 72 hours.  No results found for: HGBA1C ------------------------------------------------------------------------------------------------------------------ No results for input(s): TSH, T4TOTAL, T3FREE, THYROIDAB in the last 72 hours.  Invalid input(s):  FREET3 ------------------------------------------------------------------------------------------------------------------ No results for input(s): VITAMINB12, FOLATE, FERRITIN, TIBC, IRON, RETICCTPCT  in the last 72 hours.  Coagulation profile Recent Labs  Lab 03/10/17 1103 03/11/17 0540 03/12/17 0551  INR 1.46 2.23 1.99    No results for input(s): DDIMER in the last 72 hours.  Cardiac Enzymes No results for input(s): CKMB, TROPONINI, MYOGLOBIN in the last 168 hours.  Invalid input(s): CK ------------------------------------------------------------------------------------------------------------------ No results found for: BNP   Roxan Hockey M.D on 03/12/2017 at 7:11 PM  Between 7am to 7pm - Pager - [phone]  After 7pm go to [email or web address] - password [email or web address]  [facility] -  Office  [phone]   Voice Recognition Viviann Spare dictation system was used to create this note, attempts have been made to correct errors. Please contact the author with questions and/or clarifications.

## 2017-03-12 NOTE — Consult Note (Signed)
Consultation Note Date: 03/12/2017   Patient Name: Sandra Hobbs  DOB: 1946-02-17  MRN: [id number]  Age / Sex: 71 y.o., female  PCP: Deland Pretty, MD Referring Physician: Roxan Hockey, MD  Reason for Consultation: Establishing goals of care and Psychosocial/spiritual support  HPI/Patient Profile: 71 y.o. female  with past medical history of primary biliary cirrhosis ( DX approx 5 years ago; being evaluated at [facility] transplant center),  Portal HTN, hypercalcemia admitted on 03/10/2017 with worsening abd pain and confusion.  Now with spontaneous bacterial peritonitis as per paracentesis 11/15  Pt has been having a rapid decline and rapid response called this afternoon. Pt now in ICU. Consult for [place]. Earlier in the day, pt was accepted for transfer to [facility] to a medical bed. Now waiting to hear if she has been accepted by critical care medicine since she declined further today.. Pt at this point very unstable and would need to be intubated prior to transport to [facility]  per Dr. [name].  Clinical Assessment and Goals of Care: Met with pt's son, Lytle Michaels, daughter, Alyse Low, and  pt's husband, Kermit. Focus of initial conversation given critical nature of pt, is code status, intubation.  Pt is unable to speak for herself at this point 2/2 acute encephalopathy. Healthcare proxy would be her husband [place]   Continue Full Code. Family feels that she would want short term intubation, "to try and fight" They do not think she would want a trach were she unable to come off the breathing machine on her own.  Palliative Medicine to stay involved and will round on pt 03/13/17 Conveyed to family information on bed availability at [facility]: waiting to hear if they are going to offer ICU bed; that she would have to be intubated prior to transport  Code Status/Advance Care Planning:  Full  code    Symptom Management:   Pain: Pt appears uncomfortable. Would recommend fentanyl 49mg q2 PRN. Fentanyl IV will be shorter acting as well as having less impact on pt's BP  Palliative Prophylaxis:   Aspiration, Bowel Regimen, Delirium Protocol, Eye Care, Frequent Pain Assessment, Oral Care and Turn Reposition  Additional Recommendations (Limitations, Scope, Preferences):  Full Scope Treatment  Psycho-social/Spiritual:   Desire for further Chaplaincy support:no  Additional Recommendations: Grief/Bereavement Support  Prognosis:   Pt appears critically ill with PBC now with SBP; worsening kidney function, increased leukocytosis. I fear that if things continue to decline as rapidly as they have been her prognosis could just be hours to days. She is at high risk for an acute event e.g. Exsanguination, acute resp failure  Discharge Planning: To Be Determined      Primary Diagnoses: Present on Admission: . Primary biliary cirrhosis ([place] . Jaundice . Hyperbilirubinemia . Hypercalcemia . Dehydration . Ascites . Thrombocytopenia ([place] . Acute lower UTI   I have reviewed the medical record, interviewed the patient and family, and examined the patient. The following aspects are pertinent.  Past Medical History:  Diagnosis Date  . ASCUS favor  benign 09/2015   negative HR HPV  recommend repeat pap one year  . Cirrhosis, biliary (HCC)    primary  . Fuchs' corneal dystrophy   . History of hiatal hernia   . Hx of colonoscopy   . Osteoporosis   . Raynaud disease   . Reynolds syndrome (HCC)    patient denies states she has Raynaud's   Social History   Socioeconomic History  . Marital status: Married    Spouse name: None  . Number of children: None  . Years of education: None  . Highest education level: None  Social Needs  . Financial resource strain: None  . Food insecurity - worry: None  . Food insecurity - inability: None  . Transportation needs - medical:  None  . Transportation needs - non-medical: None  Occupational History  . None  Tobacco Use  . Smoking status: Never Smoker  . Smokeless tobacco: Never Used  Substance and Sexual Activity  . Alcohol use: No    Alcohol/week: 0.0 oz  . Drug use: No  . Sexual activity: Not Currently    Comment: 1st intercourse 19 yo-1 partner  Other Topics Concern  . None  Social History Narrative  . None   Family History  Problem Relation Age of Onset  . Stroke Mother   . Heart disease Father   . Aneurysm Father   . Diabetes Brother   . Heart failure Paternal Grandfather    Scheduled Meds: . feeding supplement (ENSURE ENLIVE)  237 mL Oral BID BM  . lactulose  30 g Oral TID  . pantoprazole (PROTONIX) IV  40 mg Intravenous Daily  . phytonadione  10 mg Subcutaneous Daily  . prednisoLONE acetate  1 drop Right Eye BID  . sodium chloride  1 drop Left Eye BID  . timolol  1 drop Right Eye BID  . ursodiol  300 mg Oral BID   Continuous Infusions: . cefTRIAXone (ROCEPHIN)  IV Stopped (03/11/17 1856)   PRN Meds:.acetaminophen **OR** acetaminophen, ondansetron **OR** ondansetron (ZOFRAN) IV Medications Prior to Admission:  Prior to Admission medications   Medication Sig Start Date End Date Taking? Authorizing Provider  CALCIUM PO Take 2 tablets by mouth daily.   Yes [provider]  cholecalciferol (VITAMIN D) 1000 units tablet Take 1,000 Units by mouth daily.    Yes [provider]  furosemide (LASIX) 20 MG tablet Take 20 mg daily by mouth. 03/04/17  Yes [provider]  prednisoLONE acetate (PRED FORTE) 1 % ophthalmic suspension Place 1 drop into the right eye 2 (two) times daily.    Yes [provider]  sodium chloride (MURO 128) 2 % ophthalmic solution Place 1 drop into the left eye 2 (two) times daily.    Yes [provider]  spironolactone (ALDACTONE) 100 MG tablet Take 100 mg daily by mouth. 03/04/17  Yes [provider]  timolol (BETIMOL)  0.5 % ophthalmic solution Place 1 drop into the right eye 2 (two) times daily.    Yes [provider]  ursodiol (ACTIGALL) 300 MG capsule Take 300 mg by mouth 2 (two) times daily.   Yes [provider]  Probiotic Product (PROBIOTIC PO) Take by mouth.    [provider]   Allergies  Allergen Reactions  . Codeine Other (See Comments)    Hallucinate  . Valacyclovir Nausea And Vomiting  . Amoxicillin Rash   Review of Systems  Unable to perform ROS: Acuity of condition    Physical Exam    Constitutional:  Acutely ill appearing female. Minimally responsive. Appears uncomfortable. Dried blood in her mouth  HENT:  Head: Normocephalic and atraumatic.  Eyes: Scleral icterus is present.  Cardiovascular: Normal rate.  Pulmonary/Chest:  Mild increased work of breathing at rest  Abdominal: A hernia is present.  Ascites; distended  Genitourinary:  Genitourinary Comments: Foley Urine very dark in color  Neurological:  encephalopathic  Skin: Skin is warm and dry.  jaundiced  Psychiatric:  Unable to test  Nursing note and vitals reviewed.   Vital Signs: BP (!) 154/104   Pulse 83   Temp (!) 97.3 F (36.3 C) (Axillary)   Resp 18   Ht 5' 5" (1.651 m)   Wt 63.2 kg (139 lb 5.3 oz)   SpO2 91%   BMI 23.19 kg/m  Pain Assessment: PAINAD   Pain Score: Asleep   SpO2: SpO2: 91 % O2 Device:SpO2: 91 % O2 Flow Rate: .   IO: Intake/output summary:   Intake/Output Summary (Last 24 hours) at 03/12/2017 1721 Last data filed at 03/12/2017 0907 Gross per 24 hour  Intake 0 ml  Output 250 ml  Net -250 ml    LBM: Last BM Date: 03/10/17 Baseline Weight: Weight: 63.2 kg (139 lb 5.3 oz) Most recent weight: Weight: 63.2 kg (139 lb 5.3 oz)     Palliative Assessment/Data:   Flowsheet Rows     Most Recent Value  Intake Tab  Referral Department  Hospitalist  Unit at Time of Referral  ICU  Palliative Care Primary Diagnosis  Sepsis/Infectious Disease  Date Notified   03/12/17  Palliative Care Type  New Palliative care  Reason for referral  Clarify Goals of Care  Date of Admission  03/10/17  Date first seen by Palliative Care  03/12/17  # of days Palliative referral response time  0 Day(s)  # of days IP prior to Palliative referral  2  Clinical Assessment  Palliative Performance Scale Score  30%  Pain Max last 24 hours  Not able to report  Pain Min Last 24 hours  Not able to report  Dyspnea Max Last 24 Hours  Not able to report  Dyspnea Min Last 24 hours  Not able to report  Nausea Max Last 24 Hours  Not able to report  Nausea Min Last 24 Hours  Not able to report  Anxiety Max Last 24 Hours  Not able to report  Anxiety Min Last 24 Hours  Not able to report  Other Max Last 24 Hours  Not able to report  Psychosocial & Spiritual Assessment  Palliative Care Outcomes  Patient/Family meeting held?  Yes  Who was at the meeting?  met with son and spoke to husband on the phone  Palliative Care follow-up planned  Yes, Facility      Time In: 1630 Time Out: 1830 Time Total: 120 min Greater than 50%  of this time was spent counseling and coordinating care related to the above assessment and plan. Staffed with Dr. Emokpae  Signed by: Sarah Grace Bullard, NP   Please contact Palliative Medicine Team phone at [phone] for questions and concerns.  For individual provider: See Amion             

## 2017-03-12 NOTE — Progress Notes (Signed)
Spoke to Dr. [name]. Transfer to [facility] is pending; possibly Mon if she improves. Per Dr. [name], husband is struggling with how aggressive to be in the setting of advanced cirrhosis. Called husband and LM to arrange family mtg for 11/18. Dr. Rowe Pavy available to meet with family between 67 and 48. Thank you for the consult, Romona Curls, ANP

## 2017-03-12 NOTE — Progress Notes (Signed)
-   Received call from [facility] Transfer center. D/W Dr. Lana Fish. Patient has been accepted for transfer to [facility].  D/W Dr. [name] as well.   Otis Brace MD, [place] 03/12/2017, 2:13 PM  Contact #  [phone]

## 2017-03-12 NOTE — Progress Notes (Signed)
When inserting the foley catheter it was noted that this pt has a Pessary device in place that is not noted in her medical records

## 2017-03-12 NOTE — Progress Notes (Signed)
OT Cancellation Note  Patient Details Name: Sandra Hobbs MRN: [id number] DOB: 1946/03/02   Cancelled Treatment:    Reason Eval/Treat Not Completed: Other (comment)  Spoke with RN and pt not well this day.  Noted pt to transfer to [facility].   Will check on pt early in the week if pt still here.  Kari Baars, [place]  Payton Mccallum D 03/12/2017, 12:35 PM

## 2017-03-12 NOTE — Significant Event (Signed)
Rapid Response Event Note  Overview: Time Called: [place] Time: 1535 Event Type: Other (Comment)(RN concern)  Initial Focused Assessment:  Ill jaundice appearing female lying in bed, not responding to RNs questions or commands. Patient lying on her side and becoming more and more fidgety and restless during examination. Bedside RN concerned for patient and her deterioration this afternoon. MD paged and exam continued.  Interventions:  Patient tachypnic and O2 in low 90s to high 80s, supplemental O2 applied via [place] blood pressure obtained and was slightly soft reading 50-35 systolic post 20 of lasix as ordered. MD arrived at bedside and agreed to transfer to step down. Orders received to transfer and have palliative see patient upon unit transfer.  Plan of Care (if not transferred):  Patient transferred to [place] for closer observation. MD asked palliative to have a goals of care meeting with family. MD to consult critical care see patient. Patients son at bedside daughter in route from [place] and husband to arrive shortly.    Juel Ripley A Rube Sanchez

## 2017-03-12 NOTE — Progress Notes (Signed)
  D/w Pharmacist, she will  discontinue IV Rocephin and  order Levaquin adjusted for renal function  Levaquin will be able to treat SBP, enterococcus faecalis and E. coli UTI  Rocephin did not cover the Enterococcus faecalis

## 2017-03-12 NOTE — Progress Notes (Signed)
[facility] Gastroenterology Progress Note  Sandra Hobbs 71 y.o. [birth year]  CC:  SBP, PBC / cirrhosis   Subjective: Patient is chronically confused. Minimally responsive to verbal command. Discussed with husband as well as Engineer, civil (consulting). Had 1 episode of vomiting/nonbloody.  ROS : Not able to obtain   Objective: Vital signs in last 24 hours: Vitals:   03/11/17 2246 03/12/17 0507  BP: 100/87 (!) 105/57  Pulse: 100 73  Resp: 18 18  Temp: 97.6 F (36.4 C) (!) 97.4 F (36.3 C)  SpO2: 98% 96%    Physical Exam:  General:  Ill-appearing patient. Confused   Head:  Normocephalic, without obvious abnormality, atraumatic  Eyes:  Scleral icterus noted   Lungs:   Clear to auscultation bilaterally, respirations unlabored  Heart:  Regular rate and rhythm, S1, S2 normal  Abdomen:   Abdomen is distended, mild discomfort but no definite tenderness. No peritoneal signs. Bowel sounds present.   Extremities: No extremity edema noted        Lab Results: Recent Labs    03/11/17 0540 03/12/17 0551  NA 139 141  K 3.9 3.8  CL 110 109  CO2 20* 21*  GLUCOSE 56* 79  BUN 51* 79*  CREATININE 1.62* 1.96*  CALCIUM 11.1* 11.4*   Recent Labs    03/11/17 0540 03/12/17 0551  AST 79* 140*  ALT 44 56*  ALKPHOS 132* 192*  BILITOT 12.7* 17.0*  PROT 4.7* 5.4*  ALBUMIN 1.6* 1.8*   Recent Labs    03/11/17 0540 03/12/17 0551  WBC 15.1* 20.3*  NEUTROABS 13.0* 17.7*  HGB 12.4 14.7  HCT 36.0 42.6  MCV 96.3 97.3  PLT 67* 84*   Recent Labs    03/11/17 0540 03/12/17 0551  LABPROT 24.5* 22.4*  INR 2.23 1.99      Assessment/Plan: - Decompensated cirrhosis from PBC  , MELD score 31 complicated by SBP as well as acute kidney injury and hepatic encephalopathy.  - SBP based on paracentesis 03/10/2017  - Hepatic encephalopathy - Acute kidney injury - UTI  Recommendations ------------------------ - Patient now has worsening leukocytosis as well as worsening LFTs and increasing  creatinine. MELD score of 31.  - Dr. Watt Climes has D/W [facility] transplant physician Dr. Angela Adam. I have also D/W Dr .Angela Adam today. She has accepted patient for transfer to [facility] for further evaluation and management. Transfer request initiated by calling at [facility] transfer for Center.  - Start albumin infusion - Continue lactulose enema  - Continue antibiotics - Prognosis remains poor.  Otis Brace MD, [facility] 03/12/2017, 9:51 AM  Contact #  [phone]

## 2017-03-12 NOTE — Progress Notes (Signed)
Order for Bipap noted, however, pt. Not in distress and appearance of vomit in oral cavity.  Requested RN to call MD and verify orders and discuss new issues with vomitus in oral cavity.  Family at bedside and aware of plan.

## 2017-03-12 NOTE — Progress Notes (Signed)
Approx 0400 this AM  pt was found to have had what looked like brown emesis in the bed. Small amt dried brown on lips & on bed pad.  Bladder scan performed as pt had not voided in 12+ hours ( not getting IV fluids & poor PO intake) Scan revealed approx 485cc urine. Unable to get pt to void using bedpan and too weak & lethargic to get OOB to [facility].  Pt is restless in bed and does not voice any discomfort but says she does not have pain & does not want to go to the bathroom.

## 2017-03-12 NOTE — Progress Notes (Signed)
eLink Physician-Brief Progress Note Patient Name: Sandra Hobbs DOB: 30-Mar-1946 MRN: [id number]   Date of Service  03/12/2017  HPI/Events of Note  Called d/t multiple issues: 1. Hypotension - BP = 74/31 - No CVL or CVP and 2. Do I want the patient to have ordered BiPAP - I did not order BiPAP. Patient is currently on 4 L/min [place] O2 with sat = 98%.  eICU Interventions  Will order: 1. Phenylephrine IV infusion. Titrate to MAP >= 65. 2. ABG now.      Intervention Category Major Interventions: Hypotension - evaluation and management  Sommer,Steven Eugene 03/12/2017, 8:44 PM

## 2017-03-13 ENCOUNTER — Inpatient Hospital Stay (HOSPITAL_COMMUNITY): Payer: PPO

## 2017-03-13 DIAGNOSIS — B9620 Unspecified Escherichia coli [E. coli] as the cause of diseases classified elsewhere: Secondary | ICD-10-CM | POA: Diagnosis not present

## 2017-03-13 DIAGNOSIS — I9589 Other hypotension: Secondary | ICD-10-CM | POA: Diagnosis not present

## 2017-03-13 DIAGNOSIS — R6521 Severe sepsis with septic shock: Secondary | ICD-10-CM | POA: Diagnosis not present

## 2017-03-13 DIAGNOSIS — Z9049 Acquired absence of other specified parts of digestive tract: Secondary | ICD-10-CM | POA: Diagnosis not present

## 2017-03-13 DIAGNOSIS — R52 Pain, unspecified: Secondary | ICD-10-CM

## 2017-03-13 DIAGNOSIS — K7290 Hepatic failure, unspecified without coma: Secondary | ICD-10-CM | POA: Diagnosis not present

## 2017-03-13 DIAGNOSIS — E860 Dehydration: Secondary | ICD-10-CM | POA: Diagnosis not present

## 2017-03-13 DIAGNOSIS — K3189 Other diseases of stomach and duodenum: Secondary | ICD-10-CM | POA: Diagnosis not present

## 2017-03-13 DIAGNOSIS — G9341 Metabolic encephalopathy: Secondary | ICD-10-CM | POA: Diagnosis not present

## 2017-03-13 DIAGNOSIS — J9600 Acute respiratory failure, unspecified whether with hypoxia or hypercapnia: Secondary | ICD-10-CM

## 2017-03-13 DIAGNOSIS — N170 Acute kidney failure with tubular necrosis: Secondary | ICD-10-CM | POA: Diagnosis not present

## 2017-03-13 DIAGNOSIS — K743 Primary biliary cirrhosis: Secondary | ICD-10-CM | POA: Diagnosis not present

## 2017-03-13 DIAGNOSIS — N390 Urinary tract infection, site not specified: Secondary | ICD-10-CM | POA: Diagnosis not present

## 2017-03-13 DIAGNOSIS — H1851 Endothelial corneal dystrophy: Secondary | ICD-10-CM | POA: Diagnosis not present

## 2017-03-13 DIAGNOSIS — D6959 Other secondary thrombocytopenia: Secondary | ICD-10-CM | POA: Diagnosis not present

## 2017-03-13 DIAGNOSIS — R0689 Other abnormalities of breathing: Secondary | ICD-10-CM | POA: Diagnosis not present

## 2017-03-13 DIAGNOSIS — K429 Umbilical hernia without obstruction or gangrene: Secondary | ICD-10-CM | POA: Diagnosis not present

## 2017-03-13 DIAGNOSIS — E440 Moderate protein-calorie malnutrition: Secondary | ICD-10-CM | POA: Diagnosis not present

## 2017-03-13 DIAGNOSIS — R188 Other ascites: Secondary | ICD-10-CM | POA: Diagnosis not present

## 2017-03-13 DIAGNOSIS — K652 Spontaneous bacterial peritonitis: Secondary | ICD-10-CM | POA: Diagnosis not present

## 2017-03-13 DIAGNOSIS — K766 Portal hypertension: Secondary | ICD-10-CM | POA: Diagnosis not present

## 2017-03-13 DIAGNOSIS — B952 Enterococcus as the cause of diseases classified elsewhere: Secondary | ICD-10-CM | POA: Diagnosis not present

## 2017-03-13 DIAGNOSIS — A4150 Gram-negative sepsis, unspecified: Secondary | ICD-10-CM | POA: Diagnosis not present

## 2017-03-13 DIAGNOSIS — K449 Diaphragmatic hernia without obstruction or gangrene: Secondary | ICD-10-CM | POA: Diagnosis not present

## 2017-03-13 DIAGNOSIS — K767 Hepatorenal syndrome: Secondary | ICD-10-CM | POA: Diagnosis not present

## 2017-03-13 LAB — BLOOD GAS, ARTERIAL
Acid-base deficit: 5.1 mmol/L — ABNORMAL HIGH (ref 0.0–2.0)
BICARBONATE: 18.8 mmol/L — AB (ref 20.0–28.0)
Drawn by: 514251
FIO2: 50
LHR: 15 {breaths}/min
MECHVT: 400 mL
O2 SAT: 94.9 %
PATIENT TEMPERATURE: 98.6
PCO2 ART: 32.5 mmHg (ref 32.0–48.0)
PEEP/CPAP: 5 cmH2O
PH ART: 7.379 (ref 7.350–7.450)
PO2 ART: 82.5 mmHg — AB (ref 83.0–108.0)

## 2017-03-13 LAB — GLUCOSE, CAPILLARY: GLUCOSE-CAPILLARY: 91 mg/dL (ref 65–99)

## 2017-03-13 LAB — CBC
HCT: 29.3 % — ABNORMAL LOW (ref 36.0–46.0)
HEMATOCRIT: 33 % — AB (ref 36.0–46.0)
HEMOGLOBIN: 9.9 g/dL — AB (ref 12.0–15.0)
Hemoglobin: 11.2 g/dL — ABNORMAL LOW (ref 12.0–15.0)
MCH: 32.7 pg (ref 26.0–34.0)
MCH: 33.6 pg (ref 26.0–34.0)
MCHC: 33.9 g/dL (ref 30.0–36.0)
MCHC: 33.8 g/dL (ref 30.0–36.0)
MCV: 96.5 fL (ref 78.0–100.0)
MCV: 99.3 fL (ref 78.0–100.0)
PLATELETS: 76 10*3/uL — AB (ref 150–400)
PLATELETS: 38 10*3/uL — AB (ref 150–400)
RBC: 3.42 MIL/uL — ABNORMAL LOW (ref 3.87–5.11)
RBC: 2.95 MIL/uL — ABNORMAL LOW (ref 3.87–5.11)
RDW: 20.3 % — AB (ref 11.5–15.5)
RDW: 20.8 % — AB (ref 11.5–15.5)
WBC: 24.4 10*3/uL — AB (ref 4.0–10.5)
WBC: 22.4 10*3/uL — ABNORMAL HIGH (ref 4.0–10.5)

## 2017-03-13 LAB — URINE CULTURE

## 2017-03-13 LAB — PROTIME-INR
INR: 2.01
Prothrombin Time: 22.6 seconds — ABNORMAL HIGH (ref 11.4–15.2)

## 2017-03-13 MED ORDER — ALBUMIN HUMAN 25 % IV SOLN: 100.0000 g | Freq: Every day | INTRAVENOUS | Status: AC

## 2017-03-13 MED ORDER — CHLORHEXIDINE GLUCONATE 0.12% ORAL RINSE (MEDLINE KIT)
15.0000 mL | Freq: Two times a day (BID) | OROMUCOSAL | Status: DC
  Administered 2017-03-13 (×2): 15 mL via OROMUCOSAL

## 2017-03-13 MED ORDER — LEVOFLOXACIN IN D5W 750 MG/150ML IV SOLN: 750.0000 mg | INTRAVENOUS | Status: AC

## 2017-03-13 MED ORDER — NOREPINEPHRINE BITARTRATE 1 MG/ML IV SOLN
0.0000 ug/min | INTRAVENOUS | Status: DC
  Administered 2017-03-13: 2 ug/min via INTRAVENOUS
  Administered 2017-03-13: 17 ug/min via INTRAVENOUS
  Administered 2017-03-13: 18 ug/min via INTRAVENOUS
  Filled 2017-03-13 (×3): qty 4

## 2017-03-13 MED ORDER — PANTOPRAZOLE SODIUM 40 MG IV SOLR: 40.0000 mg | Freq: Every day | INTRAVENOUS | Status: AC

## 2017-03-13 MED ORDER — FENTANYL CITRATE (PF) 100 MCG/2ML IJ SOLN: 50.0000 ug | Freq: Once | INTRAMUSCULAR | Status: AC

## 2017-03-13 MED ORDER — SODIUM CHLORIDE 0.9 % IV SOLN: 500.0000 mg | Freq: Two times a day (BID) | INTRAVENOUS | Status: AC

## 2017-03-13 MED ORDER — DEXTROSE 5 % IV SOLN: 0.0000 ug/min | INTRAVENOUS | Status: AC

## 2017-03-13 MED ORDER — PHENYLEPHRINE HCL-NACL 10-0.9 MG/250ML-% IV SOLN
INTRAVENOUS | Status: AC
  Administered 2017-03-13: 07:00:00
  Filled 2017-03-13: qty 250

## 2017-03-13 MED ORDER — LACTATED RINGERS IV BOLUS (SEPSIS)
500.0000 mL | Freq: Once | INTRAVENOUS | Status: AC
  Administered 2017-03-13: 500 mL via INTRAVENOUS

## 2017-03-13 MED ORDER — OCTREOTIDE LOAD VIA INFUSION
50.0000 ug | Freq: Once | INTRAVENOUS | Status: AC
  Administered 2017-03-13: 50 ug via INTRAVENOUS
  Filled 2017-03-13: qty 25

## 2017-03-13 MED ORDER — FENTANYL CITRATE (PF) 100 MCG/2ML IJ SOLN
INTRAMUSCULAR | Status: AC
  Administered 2017-03-13: 100 ug
  Filled 2017-03-13: qty 2

## 2017-03-13 MED ORDER — SODIUM CHLORIDE 0.9 % IV SOLN
0.0000 ug/min | INTRAVENOUS | Status: DC
  Administered 2017-03-13: 100 ug/min via INTRAVENOUS
  Filled 2017-03-13: qty 40

## 2017-03-13 MED ORDER — ORAL CARE MOUTH RINSE: 15.0000 mL | Freq: Four times a day (QID) | OROMUCOSAL | 0 refills | Status: AC

## 2017-03-13 MED ORDER — SODIUM CHLORIDE 0.9 % IV SOLN
500.0000 mg | Freq: Two times a day (BID) | INTRAVENOUS | Status: DC
  Administered 2017-03-13: 500 mg via INTRAVENOUS
  Filled 2017-03-13 (×2): qty 0.5

## 2017-03-13 MED ORDER — SODIUM CHLORIDE 0.9 % IV SOLN: 50.0000 ug/h | INTRAVENOUS | Status: AC

## 2017-03-13 MED ORDER — ORAL CARE MOUTH RINSE
15.0000 mL | Freq: Four times a day (QID) | OROMUCOSAL | Status: DC
  Administered 2017-03-13 (×2): 15 mL via OROMUCOSAL

## 2017-03-13 MED ORDER — VITAMIN K1 10 MG/ML IJ SOLN
10.0000 mg | Freq: Once | INTRAVENOUS | Status: AC
  Administered 2017-03-13: 10 mg via INTRAVENOUS
  Filled 2017-03-13: qty 1

## 2017-03-13 MED ORDER — SODIUM CHLORIDE 0.9 % IV SOLN: 0.0000 ug/h | INTRAVENOUS | Status: AC

## 2017-03-13 MED ORDER — ALBUMIN HUMAN 25 % IV SOLN
100.0000 g | Freq: Every day | INTRAVENOUS | Status: DC
  Administered 2017-03-13: 100 g via INTRAVENOUS
  Filled 2017-03-13: qty 50
  Filled 2017-03-13: qty 400

## 2017-03-13 MED ORDER — CHLORHEXIDINE GLUCONATE 0.12% ORAL RINSE (MEDLINE KIT): 15.0000 mL | Freq: Two times a day (BID) | OROMUCOSAL | 0 refills | Status: AC

## 2017-03-13 MED ORDER — SODIUM CHLORIDE 0.9 % IV SOLN
50.0000 ug/h | INTRAVENOUS | Status: DC
  Administered 2017-03-13: 50 ug/h via INTRAVENOUS
  Filled 2017-03-13 (×4): qty 1

## 2017-03-13 MED ORDER — FENTANYL CITRATE (PF) 100 MCG/2ML IJ SOLN
INTRAMUSCULAR | Status: AC
  Filled 2017-03-13: qty 2

## 2017-03-13 MED ORDER — FENTANYL CITRATE (PF) 100 MCG/2ML IJ SOLN
INTRAMUSCULAR | Status: AC
  Administered 2017-03-13: 50 ug
  Filled 2017-03-13: qty 2

## 2017-03-13 MED ORDER — ETOMIDATE 2 MG/ML IV SOLN
0.3000 mg/kg | Freq: Once | INTRAVENOUS | Status: AC
  Administered 2017-03-13: 18.96 mg via INTRAVENOUS

## 2017-03-13 MED ORDER — ALBUTEROL SULFATE (2.5 MG/3ML) 0.083% IN NEBU: 2.5000 mg | INHALATION_SOLUTION | RESPIRATORY_TRACT | 12 refills | Status: AC | PRN

## 2017-03-13 MED ORDER — LACTULOSE ENEMA
300.0000 mL | Freq: Once | ORAL | Status: AC
  Administered 2017-03-13: 300 mL via RECTAL
  Filled 2017-03-13: qty 300

## 2017-03-13 MED ORDER — SODIUM CHLORIDE 0.9 % IV SOLN
0.0000 ug/h | INTRAVENOUS | Status: DC
  Administered 2017-03-13: 150 ug/h via INTRAVENOUS
  Administered 2017-03-13: 25 ug/h via INTRAVENOUS
  Filled 2017-03-13 (×2): qty 50

## 2017-03-13 NOTE — Progress Notes (Signed)
Report given to nurse [name] at [place].

## 2017-03-13 NOTE — Progress Notes (Signed)
Pharmacy Antibiotic Note  Sandra Hobbs is a 71 y.o. female admitted on 03/10/2017 with UTI and SBP.  Patient is currently on Rocephin & clinically deteriorating.  Urine cx now also + >100K enterococcus which is not covered by Rocephin.  Discussed options with Dr [name] & based on available cx sensitivities recommended Levaquin for coverage of SBP + UTI.  Pharmacy consulted to dose Levaquin for renal function on 11/17 and now CCM consulted pharmacy to dose meropenem to empirically cover ESBP organisms until all cultures are final  Plan: 1) Continue Levaquin 750mg  IV q48h 2) Meropenem 500mg  IV q12 with CrCl 10-25 ml/min 3) Monitor renal function & culture data.   Height: 5\' 5"  (165.1 cm) Weight: 139 lb 5.3 oz (63.2 kg) IBW/kg (Calculated) : 57  Temp (24hrs), Avg:97.4 F (36.3 C), Min:96.6 F (35.9 C), Max:98.3 F (36.8 C)  Recent Labs  Lab 03/10/17 1103 03/11/17 0540 03/12/17 0551 03/12/17 1039 03/13/17 0500  WBC 6.9 15.1* 20.3*  --  24.4*  CREATININE 1.48* 1.62* 1.96* 2.13* 2.29*    Estimated Creatinine Clearance: 20.6 mL/min (A) (by C-G formula based on SCr of 2.29 mg/dL (H)).    Allergies  Allergen Reactions  . Codeine Other (See Comments)    Hallucinate  . Valacyclovir Nausea And Vomiting  . Amoxicillin Rash    Antimicrobials this admission:  11/15 Rocephin >>11/17 11/17 Levaquin>> 11/18 meropenem >>  Microbiology results:  11/15 UCx: >100K Ecoli (sens CTX, FQ, R=amp, zosyn), Enterococcus faecalis- sens pending 11/15 Ascitic fluid: abundant GNR  Thank you for allowing pharmacy to be a part of this patient's care.  Adrian Saran, PharmD, BCPS Pager [phone] 03/13/2017 12:33 PM

## 2017-03-13 NOTE — Progress Notes (Signed)
Entered room to assess patient and document vent check, transport her to move patient to [place] per MD orders.

## 2017-03-13 NOTE — Procedures (Signed)
Endotracheal Intubation Procedure Note  Indication for endotracheal intubation: airway compromise. Airway Assessment: Mallampati Class: I (soft palate, uvula, fauces, and tonsillar pillars visible). Sedation: fentanyl 50 MG .and etomidate 20 MG Paralytic: none. Lidocaine: no. Atropine: no. Equipment: Macintosh 4 laryngoscope blade. Cricoid Pressure: no. Number of attempts: 1. ETT location confirmed by by CXR and end tidal.  Xee Hollman 03/13/2017  4:00 AM

## 2017-03-13 NOTE — Procedures (Signed)
Central Venous Catheter Insertion Procedure Note Sandra Hobbs [id number] 1945/08/04   Procedure: Insertion of Central Venous Catheter Indications: Vasopressor need  Procedure Details Consent: Obtained from Husband who is POA  A time-out was completed verifying correct patient, procedure, site, positioning, The patient was placed in a dependent position appropriate for central line placement based on the vein to be cannulated. The patient's left neck was prepped and draped in sterile fashion. 1% Lidocaine was used to anesthetize the surrounding skin area. A triple lumen catheter was introduced into the the internal jugular using the Seldinger technique and under ultrasound guidance. The catheter was threaded smoothly over the guide wire and appropriate blood return was obtained. Each lumen of the catheter was evacuated of air and flushed with sterile saline. The catheter was then sutured in place to the skin and a sterile dressing applied. Perfusion to the extremity distal to the point of catheter insertion was checked and found to be adequate.   A chest x-ray was ordered to assess for pneumothorax and verify endotrachealtube placement.  Estimated Blood Loss: 50 CC The patient tolerated the procedure well and there were no complications.   [name] [name] 03/13/2017, 4:30AM

## 2017-03-13 NOTE — Progress Notes (Signed)
[facility] Gastroenterology Progress Note  Sandra Hobbs 71 y.o. Aug 28, 1945  CC:  SBP, PBC / cirrhosis   Subjective: Yesterday's events noted. Patient currently intubated. Remains on pressor support. Dark green liquid noted in the OG section. No overt melena or blood in the stool. Discussed with the nursing staff.  ROS : Not able to obtain   Objective: Vital signs in last 24 hours: Vitals:   03/13/17 0900 03/13/17 1000  BP: (!) 105/49 (!) 106/49  Pulse:    Resp: 16 16  Temp:    SpO2: 100% 100%    Physical Exam:  General:  Ill-appearing patient. Intubated. On mechanical ventilation   Head:  Normocephalic, without obvious abnormality, atraumatic  Eyes:  Scleral icterus noted   Lungs:   Coarse breath sounds bilaterally   Heart:  Regular rate and rhythm, S1, S2 normal  Abdomen:   Abdomen  distended, periumbilical hernia and ventral hernia noted. Not able to appreciate any tenderness. No peritoneal signs. Bowel sounds present.   Extremities: Trace edema        Lab Results: Recent Labs    03/12/17 1039 03/13/17 0500  NA 140 144  K 4.1 3.1*  CL 109 113*  CO2 21* 20*  GLUCOSE 86 79  BUN 79* 95*  CREATININE 2.13* 2.29*  CALCIUM 11.4* 11.1*   Recent Labs    03/12/17 1039 03/13/17 0500  AST 160* 153*  ALT 64* 54  ALKPHOS 183* 148*  BILITOT 17.8* 19.0*  PROT 5.3* 5.2*  ALBUMIN 1.7* 2.9*   Recent Labs    03/11/17 0540 03/12/17 0551 03/13/17 0500  WBC 15.1* 20.3* 24.4*  NEUTROABS 13.0* 17.7*  --   HGB 12.4 14.7 11.2*  HCT 36.0 42.6 33.0*  MCV 96.3 97.3 96.5  PLT 67* 84* 76*   Recent Labs    03/12/17 0551 03/13/17 0500  LABPROT 22.4* 22.6*  INR 1.99 2.01      Assessment/Plan: - Decompensated cirrhosis from PBC  , MELD score 33 complicated by SBP as well as acute kidney injury and hepatic encephalopathy and UTI  - SBP based on paracentesis 03/10/2017  - Hepatic encephalopathy - Acute kidney injury. ?? HRS  - UTI . Urine culture positive for  Escherichia coli and enterococcus faecalis - Dark liquid in  NG suction.  Recommendations ------------------------ - Patient's clinical condition is deteriorating. has worsening leukocytosis as well as worsening LFTs and increasing creatinine. MELD score of 33 based on today's labs. Currently intubated.  - Awaiting bed at [facility] ICU - Discussed with the nursing staff. Continue lactulose enema   - Consider changing phenylephrine to norepinephrine  which might help her with possible hepatorenal syndrome - Continue albumin  IV 100 g per day. Start octreotide infusion. Continue antibiotics - If no improvement in kidney function, consider nephrology consultation. - Prognosis remains poor. No family at bedside.  Otis Brace MD, [place] 03/13/2017, 10:48 AM  Contact #  [phone]

## 2017-03-13 NOTE — Progress Notes (Signed)
Daily Progress Note   Patient Name: Sandra Hobbs       Date: 03/13/2017 DOB: [birth year]  Age: 71 y.o. MRN#: [id number] Attending Physician: Eber Jones, MD Primary Care Physician: Deland Pretty, MD Admit Date: 03/10/2017  Reason for Consultation/Follow-up: Establishing goals of care and Psychosocial/spiritual support  Subjective: Chart reviewed. No family at the bedside. Pt intubated last night 2/2 worsening mental status and hypotension. On pressors. Conversations have continued with [facility]. They have accepted her but we are waiting for an ICU bed  Length of Stay: 3  Current Medications: Scheduled Meds:  . chlorhexidine gluconate (MEDLINE KIT)  15 mL Mouth Rinse BID  . feeding supplement (ENSURE ENLIVE)  237 mL Oral BID BM  . lactulose  30 g Oral TID  . mouth rinse  15 mL Mouth Rinse QID  . pantoprazole (PROTONIX) IV  40 mg Intravenous Daily  . phytonadione  10 mg Subcutaneous Daily  . prednisoLONE acetate  1 drop Right Eye BID  . sodium chloride  1 drop Left Eye BID  . timolol  1 drop Right Eye BID  . ursodiol  300 mg Oral BID    Continuous Infusions: . phenylephrine    . fentaNYL infusion INTRAVENOUS 75 mcg/hr (03/13/17 0600)  . levofloxacin (LEVAQUIN) IV Stopped (03/12/17 2241)  . phenylephrine (NEO-SYNEPHRINE) Adult infusion      PRN Meds: acetaminophen **OR** acetaminophen, albuterol  Physical Exam  Constitutional: She appears well-developed and well-nourished.  Acutely ill; now intubated  Eyes: Scleral icterus is present.  Cardiovascular:  Has been hypotensive. Now on neo LE edema  Pulmonary/Chest:  Intubated  Abdominal:  ascites  Genitourinary:  Genitourinary Comments: foley  Neurological:  On fentanyl continuous infusion. Intubated.  Unresponsive  Skin: Skin is warm and dry.  jaundiced  Psychiatric:  Unable to test  Nursing note and vitals reviewed.           Vital Signs: BP (!) 105/53   Pulse 83   Temp 98.3 F (36.8 C) (Axillary)   Resp 16   Ht 5' 5"  (1.651 m)   Wt 63.2 kg (139 lb 5.3 oz)   SpO2 100%   BMI 23.19 kg/m  SpO2: SpO2: 100 % O2 Device: O2 Device: Nasal Cannula O2 Flow Rate: O2 Flow Rate (L/min): 4 L/min  Intake/output summary:  Intake/Output Summary (Last 24 hours) at 03/13/2017 0801 Last data filed at 03/13/2017 0600 Gross per 24 hour  Intake 823.63 ml  Output 925 ml  Net -101.37 ml   LBM: Last BM Date: 03/12/17(small amount) Baseline Weight: Weight: 63.2 kg (139 lb 5.3 oz) Most recent weight: Weight: 63.2 kg (139 lb 5.3 oz)       Palliative Assessment/Data:    Flowsheet Rows     Most Recent Value  Intake Tab  Referral Department  Hospitalist  Unit at Time of Referral  ICU  Palliative Care Primary Diagnosis  Sepsis/Infectious Disease  Date Notified  03/12/17  Palliative Care Type  New Palliative care  Reason for referral  Clarify Goals of Care  Date of Admission  03/10/17  Date first seen by Palliative Care  03/12/17  # of days Palliative referral response time  0 Day(s)  # of days IP prior to Palliative referral  2  Clinical Assessment  Palliative Performance Scale Score  30%  Pain Max last 24 hours  Not able to report  Pain Min Last 24 hours  Not able to report  Dyspnea Max Last 24 Hours  Not able to report  Dyspnea Min Last 24 hours  Not able to report  Nausea Max Last 24 Hours  Not able to report  Nausea Min Last 24 Hours  Not able to report  Anxiety Max Last 24 Hours  Not able to report  Anxiety Min Last 24 Hours  Not able to report  Other Max Last 24 Hours  Not able to report  Psychosocial & Spiritual Assessment  Palliative Care Outcomes  Patient/Family meeting held?  Yes  Who was at the meeting?  met with son and spoke to husband on the phone  Palliative  Care follow-up planned  Yes, Facility      Patient Active Problem List   Diagnosis Date Noted  . Palliative care encounter   . 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 Primary biliary cirrhosis ([place]) 03/10/2017  . Jaundice 03/10/2017  . Hyperbilirubinemia 03/10/2017  . Hypercalcemia 03/10/2017  . Dehydration 03/10/2017  . Ascites 03/10/2017  . Thrombocytopenia ([place]) 03/10/2017  . Acute lower UTI 03/10/2017    Palliative Care Assessment & Plan   Patient Profile: 71 y.o. female  with past medical history of primary biliary cirrhosis ( DX approx 5 years ago; being evaluated at [facility] transplant center),  Portal HTN, hypercalcemia admitted on 03/10/2017 with worsening abd pain and confusion.  Now with spontaneous bacterial peritonitis as per paracentesis 11/15  Pt has been having a rapid decline and rapid response called this afternoon. Pt now in ICU. Consult for [place] .  Met with family 11/17 for [place]. In many ways family very shocked about sudden decline. All state that she has declined since endoscopy/colonscopy approx 1 week ago. Per daughter, pt was very motivated to obtain liver transplant if possible. Family shared that pt would not want intubation long term    Recommendations/Plan:  Continue full scope of care  Palliative Medicine to stay peripherally involved at this point. Family is hopefull for transfer to [facility] ICU as soon as bed is available  Goals of Care and Additional Recommendations:  Limitations on Scope of Treatment: Full Scope Treatment  Code Status:    Code Status Orders  (From admission, onward)        Start     Ordered   03/10/17 1352  Full code  Continuous     03/10/17  1352    Code Status History    Date Active Date Inactive Code Status Order ID Comments User Context   This patient has a current code status but no historical code status.    Advance Directive Documentation     Most Recent Value  Type of Advance Directive   Living will  Pre-existing out of facility DNR order (yellow form or pink MOST form)  No data  "MOST" Form in Place?  No data       Prognosis:   Unable to determine  Discharge Planning:  To Be Determined  Care plan was discussed with [place], RN at the bedside  Thank you for allowing the Palliative Medicine Team to assist in the care of this patient.   Time In: 0730 Time Out: 0800 Total Time 30 min Prolonged Time Billed  no       Greater than 50%  of this time was spent counseling and coordinating care related to the above assessment and plan.  Dory Horn, NP  Please contact Palliative Medicine Team phone at [phone] for questions and concerns.

## 2017-03-13 NOTE — Consult Note (Signed)
PULMONARY / CRITICAL CARE MEDICINE   Name: Sandra Hobbs MRN: [id number] DOB: [birth year]    ADMISSION DATE:  03/10/2017 CONSULTATION DATE:  03/13/2017   CHIEF COMPLAINT:  Clinical Deterioration   HISTORY OF PRESENT ILLNESS:   71 y.o.femalewith a past medical history of primary biliary cirrhosis for 20 years has been havingworsening abdominal discomfort and jaundice for the past couple weeks. She was referred to the transplant center at [facility] for evaluation. She was seen there about a week ago when she underwent upper and lower endoscopies. After EGD/colonoscopy patient has been slowly deteriorating.  Patient's abdominal pain has worsened along with worsening distention of her abdomen. The jaundice is also worsened. Family states she was also having many diarrhea episodes due to colonoscopy prep even after the procedure was completed.Patient went to see PCP who instructed her to come to ER. During this admission she has been diagnosed with SBP s/p paracentesis and started on ceftriaxone which was switched to levofloxacin. Patient is growing e.coli and enterococcus in the urine. Patient also has decompensated cirrhosis and worsening hepatic encephalopathy and was transferred to the ICU   Patient was seen in the ICU. All history was obtained from medical records and family at bedside. Patient is waiting to be transferred to [facility] for evaluation for liver transplant. Due to worsening metal status patient will need to be intubated for safe transfer. 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  PAST SURGICAL HISTORY: She  has a past surgical history that includes Tonsillectomy; Tubal ligation; Appendectomy; Cholecystectomy; Corneal transplant;  Knee surgery (Right); Retinal detachment surgery; Cataract extraction; Refractive surgery; eyelid surg; and Colonoscopy.  Allergies  Allergen Reactions  . Codeine Other (See Comments)    Hallucinate  . Valacyclovir Nausea And Vomiting  . Amoxicillin Rash    No current facility-administered medications on file prior to encounter.    Current Outpatient Medications on File Prior to Encounter  Medication Sig  . CALCIUM PO Take 2 tablets by mouth daily.  . cholecalciferol (VITAMIN D) 1000 units tablet Take 1,000 Units by mouth daily.   . furosemide (LASIX) 20 MG tablet Take 20 mg daily by mouth.  . prednisoLONE acetate (PRED FORTE) 1 % ophthalmic suspension Place 1 drop into the right eye 2 (two) times daily.   . sodium chloride (MURO 128) 2 % ophthalmic solution Place 1 drop into the left eye 2 (two) times daily.   Marland Kitchen spironolactone (ALDACTONE) 100 MG tablet Take 100 mg daily by mouth.  . timolol (BETIMOL) 0.5 % ophthalmic solution Place 1 drop into the right eye 2 (two) times daily.   . ursodiol (ACTIGALL) 300 MG capsule Take 300 mg by mouth 2 (two) times daily.  . Probiotic Product (PROBIOTIC PO) Take by mouth.    FAMILY HISTORY:  Her indicated that the status of her mother is unknown. She indicated that the status of her father is unknown. She indicated that the status of her brother is unknown. She indicated that the status of her paternal grandfather is unknown.   SOCIAL HISTORY: She  reports that  has never smoked. she has never used smokeless tobacco. She reports that she does not drink alcohol or use drugs.  REVIEW OF SYSTEMS:   Unable to obtain    VITAL SIGNS: BP Marland Kitchen)  105/53   Pulse 83   Temp 98.3 F (36.8 C) (Axillary)   Resp 16   Ht 5\' 5"  (1.651 m)   Wt 139 lb 5.3 oz (63.2 kg)   SpO2 100%   BMI 23.19 kg/m   HEMODYNAMICS:    VENTILATOR SETTINGS: Vent Mode: PRVC FiO2 (%):  [40 %] 40 % Set Rate:  [15 bmp] 15 bmp Vt Set:  [400 mL] 400 mL PEEP:  [5 cmH20] 5  cmH20 Plateau Pressure:  [22 cmH20] 22 cmH20  INTAKE / OUTPUT: I/O last 3 completed shifts: In: 0  Out: 450 [Urine:450]  PHYSICAL EXAMINATION: General:  Female appropriate for age lying in bed on her side clearly confused. Dark blood in her mouth with dark melanotic content coming out of her mouth. +jaundice  Neuro:  Opens eyes to verbal stimuli. PERRLA  With draws all 4 extremities to pain. Faint incoherent mumbling.  Cardiovascular:  S1S2 RRR Lungs:  Poor to fair air entry B/L with crackles B/L bases  tachypnic with shallow breathing  Abdomen:  Distended with fluid wave tender in all 4 quadrants Musculoskeletal:  +pulses in all 4 ext  Moves all 4 ext spontaneously  Skin:  Intact jaundice   LABS:  BMET Recent Labs  Lab 03/12/17 0551 03/12/17 1039 03/13/17 0500  NA 141 140 144  K 3.8 4.1 3.1*  CL 109 109 113*  CO2 21* 21* 20*  BUN 79* 79* 95*  CREATININE 1.96* 2.13* 2.29*  GLUCOSE 79 86 79    Electrolytes Recent Labs  Lab 03/12/17 0551 03/12/17 1039 03/13/17 0500  CALCIUM 11.4* 11.4* 11.1*    CBC Recent Labs  Lab 03/11/17 0540 03/12/17 0551 03/13/17 0500  WBC 15.1* 20.3* 24.4*  HGB 12.4 14.7 11.2*  HCT 36.0 42.6 33.0*  PLT 67* 84* 76*    Coag's Recent Labs  Lab 03/11/17 0540 03/12/17 0551 03/13/17 0500  INR 2.23 1.99 2.01    Sepsis Markers No results for input(s): LATICACIDVEN, PROCALCITON, O2SATVEN in the last 168 hours.  ABG Recent Labs  Lab 03/12/17 2045 03/13/17 0430  PHART 7.409 7.379  PCO2ART 30.1* 32.5  PO2ART 67.6* 82.5*    Liver Enzymes Recent Labs  Lab 03/12/17 0551 03/12/17 1039 03/13/17 0500  AST 140* 160* 153*  ALT 56* 64* 54  ALKPHOS 192* 183* 148*  BILITOT 17.0* 17.8* 19.0*  ALBUMIN 1.8* 1.7* 2.9*    Cardiac Enzymes No results for input(s): TROPONINI, PROBNP in the last 168 hours.  Glucose No results for input(s): GLUCAP in the last 168 hours.  Imaging Dg Chest Port 1 View  Result Date:  03/13/2017 CLINICAL DATA:  Endotracheal tube and central line placement. Orogastric tube placement. EXAM: PORTABLE CHEST 1 VIEW COMPARISON:  None. FINDINGS: The patient's endotracheal tube is seen ending 1-2 cm above the carina. This could be retracted 2 cm. The patient's enteric tube is seen ending overlying the body of the stomach. The left IJ line is noted ending about the distal SVC. Retrocardiac airspace opacification raises concern for pneumonia. Mild right basilar airspace opacity is also noted. A small left pleural effusion is noted. No pneumothorax is seen. The cardiomediastinal silhouette is normal in size. No acute osseous abnormalities are identified. IMPRESSION: 1. Endotracheal tube seen ending 1-2 cm above the carina. This could be retracted 2 cm, as deemed clinically appropriate. 2. Enteric tube noted ending overlying the body of the stomach. 3. Left IJ line noted ending about the distal SVC. 4. Retrocardiac airspace opacification raises concern for pneumonia.  Mild right basilar airspace opacity also noted. Small left pleural effusion seen. Electronically Signed   By: Garald Balding M.D.   On: 03/13/2017 04:37     STUDIES:    CULTURES: Ecoli and Enterococcus: Urine    ANTIBIOTICS: Levofloxacin 11/17  SIGNIFICANT EVENTS: Intubated overnight 11/18 for worsening mental status   LINES/TUBES: ETT 11/18 Left TLC 11/18  DISCUSSION: 71 year old female with PBC upgraded to the ICU for worsening mental status   ASSESSMENT / PLAN:  PULMONARY A: VDRF secondary to worsening mental status/ safe transfer/ tachypnea  P:   VAP precautions No SBT Saturating well. Reduce Fio2    CARDIOVASCULAR A:  Hypotension secondary to septic shock (urinary source and SBP) vs chronic low BP due to cirrhosis P:  Started on pressors. Patient BP always chronically low. Maintain MAP >55 which might be her baseline instead of MAP>65 No history of CAD/MI CHF    RENAL A:  Acute renal failure  secondary to pre-renal (poor po intake) vs renal (r/o hepatorenal syndrome)  P:   Avoid nephrotoxic medications Needs 1 G /KG of albumin  Monitor I/O Dark urine in foley Daily weights  500 cc LR bolus now  GASTROINTESTINAL A:  Primary biliary cirrhosis Dark melanotic gastric contents from NG tube SBP  P:   Awaiting transfer to [facility] for liver transplant evaluation. MELD 31 NPO due to significant amount of gastric content coming out of NG tube intermittent low suction F/u gastro-occult  Would hold any further oral lactulose GI following F/U cultures from paracentesis and continue IV abx  HEMATOLOGIC A:  Thrombocytopenia secondary to cirrhosis P:  F/u Gastroccult Hg/plts/wbc all increased. Patient might be hypovolemic causing increased concentration.   INFECTIOUS A:  Ecoli and Enterococcus UTI SBP Leukocytosis   P:   Continue with levofloxacin F/u blood cultures and ascitic fluid cultures   ENDOCRINE A:  No acute issues   P:   ISS  NEUROLOGIC A:  Encephalopathy secondary to multifactorial (hepatic encephalopathy/ sepsis/ metabolic) P:   currently sedated on fentanyl maintain raas -1 Possibly continue rectal lactulose.    FAMILY  - Updates: Had extensive discussion with family at bedside. Family is fully aware why patient needs to be intubated now (both for tachypnea and for air way protection for transfer). All questions answered.   Family was notified after intubation and central line placement    Carlyon Prows D.O  Pulmonary and [place] Pager: [phone]  03/13/2017, 6:21 AM

## 2017-03-13 NOTE — Discharge Summary (Signed)
Physician Discharge Summary       Patient ID: Sandra Hobbs MRN: [id number] DOB/AGE: 05/19/1945 71 y.o.  Admit date: 03/10/2017 Discharge date: 03/13/2017  Discharge Diagnoses:   Septic shock E. Coli and enterococcus urinary tract infection Spontaneous bacterial peritonitis Decompensated cirrhosis/ end-stage primary biliary cirrhosis Hepatic encephalopathy Acute kidney injury r/o HRS   Detailed Hospital Course:  71 y.o.femalewith a past medical history of primary biliary cirrhosis worsening abdominal discomfort and jaundice for the past couple weeks. She was referred to the transplant center at [facility] for evaluation. She was seen there about a week ago when she underwent upper and lower endoscopies with plans to follow-up after Thanksgiving. After EGD/colonoscopy patient has been slowly deteriorating.  Patient's abdominal pain has worsened along with worsening distention of her abdomen. The jaundice is also worsened. Family states she was also having many diarrhea episodes due to colonoscopy prep even after the procedure was completed.Patient went to see PCP/ gastroenterologist who instructed her to come to ER. Denied fever/chills with some nausea, no vomiting.  Recently taken off her diuretics and then resumed due to increasing abdominal girth.   Underwent paracentesis on 11/15 following ABD ultrasound with 3.8L of turbid yellow fluid with total WBC 7800 and 65% neutrophils concerning for spontaneous bacterial peritonitis.  Patient was started on ceftriaxone 2gm every 24 hours with cultures pending.  Patient was switched from ceftriaxone to levaquin on 11/17 to cover SBP, enterococcus faecalis (not covered by ceftriaxone), and E. Coli from UTI.  Due to worsening mental status, patient transferred to ICU on 11/17 and intubated for airway protection and for safe transfer pending to [facility].  She required vasopressor support for which phenylephrine was started and switched to  norepinephrine after central line placement.  Urine culture finalized on 11/18 showing E. Coli and enterococcus faecalis and therefore meropenem added 11/18 to levaquin for ESBL coverage.  Diuretics held due to progressing renal failure.  Possible hepatorenal syndrome treated with octreotide infusion; albumin given for volume expansion with septic shock.  On 11/18, ICU bed assigned at [facility] and therefore patient will be transferred for ongoing evaluation of possible transplant in which patient is already established with.  Patient will be transferred by [place].    Discharge Plan by active problems   PULMONARY A:  Acute respiratory insufficiency secondary to worsening hepatic encephalopathy and septic shock P:   Full MV support with PRVC 8cc/kg, rate 15, peep 5 Wean FiO2 for sats > 92% Trend CXR and ABG VAP precautions No SBT for now   CARDIOVASCULAR A:   Septic Shock related to UTI/ SBP +/- chronic low BP due to cirrhosis P:  Tele monitoring Patient BP always chronically low. Maintain MAP >55 which might be her baseline instead of MAP>65 Continue norepinephrine  RENAL A:   AKI vs ATN - r/o hepatorenal syndrome  UTI Hypokalemia P:   Continue octreotide infusion Continue albumin 100 g IV daily Holding diuretics  Consider nephrology consultation if no improvement in renal function Trend BMP /mag/ phos/ urinary output/ daily weights Replace electrolytes as indicated Avoid nephrotoxic agents, ensure adequate renal perfusion   GASTROINTESTINAL A:   End-stage Primary biliary cirrhosis- MELD score 33 on 11/18 Dark melanotic gastric contents from NG tube SBP - s/p paracentesis 11/15 P:   Transfer to [facility] for ongoing evaluation for liver transplant NPO PPI for SUP Awaiting transfer to [facility] for liver transplant evaluation. MELD 31 S/p lactulose enema per GI recs 11/18 F/U cultures from paracentesis and continue  IV abx  HEMATOLOGIC A:   Thrombocytopenia  secondary to cirrhosis Anemia- chronic - initially all elevated (Hgb/ Plts/ WBC- possibly secondary to hypovolemia, increased concentration)  P:  Trend CBC Trend coags S/p vitamin K 10 mg x 1 on 11/18 F/u Gastroccult Transfuse for Hgb < 7 Monitoring for bleeding   INFECTIOUS A:   Ecoli and Enterococcus UTI R/o bacteremia  SBP Leukocytosis   P:   Continue with levofloxacin, meropenem added 11/18 to cover ESBL organisms F/u blood cultures and ascitic fluid cultures Trend fever/ WBC  ENDOCRINE A:   No acute issues  P:   Monitor glucose, at risk for hypoglycemia  NEUROLOGIC A:   Encephalopathy- multifactorial secondary to hepatic encephalopathy/ sepsis/ metabolic P:   RASS goal: -1 PAD protocol with fentanyl gtt Lactulose as above Trend ammonia [facility] tests/ studies   STUDIES:  11/15 ABD U/S: Findings consistent with hepatic cirrhosis. Splenomegaly is noted. Hepatofugal flow is noted in main portal vein consistent with portal hypertension.  Mild ascites is noted.  Nonobstructive left renal calculus is noted. No hydronephrosis or renal obstruction is noted.  CULTURES: Peritoneal fluid 11/15:  GNR >> Peritoneal GS 11/15:  Abundant GNR >> UC 11/15:  Ecoli and Enterococcus Faecalis  BC 11/17:  >>  ANTIBIOTICS: Ceftriaxone 11/15 >> 11/17 Levofloxacin 11/17 >> Meropenem 11/18 >>  SIGNIFICANT EVENTS: 11/15  Admit, paracentesis 11/17  Transfer to ICU/ intubated/ shock 11/18   Transfer to [facility]   LINES/TUBES: ETT 11/18 >> OGT 11/18 >> Left TLC CVC 11/18 >>  Consults  Gastroenterology Palliative care medicine  Discharge Exam: BP (!) 99/51 (BP Location: Left Arm)   Pulse 83   Temp 98 F (36.7 C) (Oral)   Resp 11   Ht 5' 5"  (1.651 m)   Wt 139 lb 5.3 oz (63.2 kg)   SpO2 96%   BMI 23.19 kg/m   General:  Critically ill, thin female sedate on MV, NAD HEENT: MM pink/moist, ETT 8.0 at 24 at teeth- retracted to 23 due to diminished  L breath sounds, improved, OGT, pupils 3/sluggish, scleral icterus  Neuro:  Sedate, no f/c, occasional will spont move CV: SR 83, no m/r/g PULM: even/non-labored on MV, lungs bilaterally clear GI: distended, round, +BS, soft soft periumbilical hernia Extremities: warm/dry, trace BL edema  Skin: Jaundiced  Labs at discharge Lab Results  Component Value Date   CREATININE 2.29 (H) 03/13/2017   BUN 95 (H) 03/13/2017   NA 144 03/13/2017   K 3.1 (L) 03/13/2017   CL 113 (H) 03/13/2017   CO2 20 (L) 03/13/2017   Lab Results  Component Value Date   WBC 22.4 (H) 03/13/2017   HGB 9.9 (L) 03/13/2017   HCT 29.3 (L) 03/13/2017   MCV 99.3 03/13/2017   PLT 38 (L) 03/13/2017   Lab Results  Component Value Date   ALT 54 03/13/2017   AST 153 (H) 03/13/2017   ALKPHOS 148 (H) 03/13/2017   BILITOT 19.0 (HH) 03/13/2017   Lab Results  Component Value Date   INR 2.01 03/13/2017   INR 1.99 03/12/2017   INR 2.23 03/11/2017    Current radiology studies Dg Chest Port 1 View  Result Date: 03/13/2017 CLINICAL DATA:  Endotracheal tube and central line placement. Orogastric tube placement. EXAM: PORTABLE CHEST 1 VIEW COMPARISON:  None. FINDINGS: The patient's endotracheal tube is seen ending 1-2 cm above the carina. This could be retracted 2 cm. The patient's enteric tube is seen ending overlying the body of the  stomach. The left IJ line is noted ending about the distal SVC. Retrocardiac airspace opacification raises concern for pneumonia. Mild right basilar airspace opacity is also noted. A small left pleural effusion is noted. No pneumothorax is seen. The cardiomediastinal silhouette is normal in size. No acute osseous abnormalities are identified. IMPRESSION: 1. Endotracheal tube seen ending 1-2 cm above the carina. This could be retracted 2 cm, as deemed clinically appropriate. 2. Enteric tube noted ending overlying the body of the stomach. 3. Left IJ line noted ending about the distal SVC. 4.  Retrocardiac airspace opacification raises concern for pneumonia. Mild right basilar airspace opacity also noted. Small left pleural effusion seen. Electronically Signed   By: Garald Balding M.D.   On: 03/13/2017 04:37    Disposition:  Final discharge disposition not confirmed   Allergies as of 03/13/2017      Reactions   Codeine Other (See Comments)   Hallucinate   Valacyclovir Nausea And Vomiting   Amoxicillin Rash      Medication List    STOP taking these medications   CALCIUM PO   cholecalciferol 1000 units tablet Commonly known as:  VITAMIN D   furosemide 20 MG tablet Commonly known as:  LASIX   prednisoLONE acetate 1 % ophthalmic suspension Commonly known as:  PRED FORTE   PROBIOTIC PO   sodium chloride 2 % ophthalmic solution Commonly known as:  MURO 128   spironolactone 100 MG tablet Commonly known as:  ALDACTONE   timolol 0.5 % ophthalmic solution Commonly known as:  BETIMOL   ursodiol 300 MG capsule Commonly known as:  ACTIGALL     TAKE these medications   albumin human 25 % bottle Inject 400 mLs (100 g total) daily into the vein. Start taking on:  03/14/2017   albuterol (2.5 MG/3ML) 0.083% nebulizer solution Commonly known as:  PROVENTIL Take 3 mLs (2.5 mg total) every 2 (two) hours as needed by nebulization for wheezing or shortness of breath.   chlorhexidine gluconate (MEDLINE KIT) 0.12 % solution Commonly known as:  PERIDEX 15 mLs 2 (two) times daily by Mouth Rinse route. Start taking on:  03/14/2017   fentaNYL 2,500 mcg in sodium chloride 0.9 % 200 mL Inject 0-400 mcg/hr continuous into the vein.   levofloxacin 750 MG/150ML Soln Commonly known as:  LEVAQUIN Inject 150 mLs (750 mg total) every other day into the vein. Start taking on:  03/14/2017   meropenem 500 mg in sodium chloride 0.9 % 50 mL Inject 500 mg every 12 (twelve) hours into the vein. Start taking on:  03/14/2017   mouth rinse Liqd solution 15 mLs QID by Mouth Rinse  route. Start taking on:  03/14/2017   norepinephrine 4 mg in dextrose 5 % 250 mL Inject 0-40 mcg/min continuous into the vein.   octreotide 500 mcg in sodium chloride 0.9 % 250 mL Inject 50 mcg/hr continuous into the vein.   pantoprazole 40 MG injection Commonly known as:  PROTONIX Inject 40 mg daily into the vein. Start taking on:  03/14/2017        Discharged Condition: critical  CCT 45 mins  Kennieth Rad, AGACNP-BC [place] Pgr: [phone] or if no answer [phone] 03/13/2017, 10:43 PM

## 2017-03-13 NOTE — Progress Notes (Addendum)
  Appears to be septic shock with gram-negative's in the blood, likely related to SBP -present on admission Urine culture is showing E. coli and enterococcus  We will continue Levaquin but add meropenem to cover ESBL organisms. AKI-may be hepatorenal or related to ATN from sepsis Agree with volume expansion with albumin and adding octreotide and Levophed  Son and daughter updated at bedside Not a transplant candidate until infection clears Dark aspirate from NG tube, hemoglobin dropped from 12-11, will monitor  Additional critical care time x 22m  Rakesh V. Elsworth Soho MD

## 2017-03-13 NOTE — Progress Notes (Signed)
PROGRESS NOTE    Sandra Hobbs  RVI:[id number] DOB: [birth year] DOA: 03/10/2017 PCP: Deland Pretty, MD    Brief Narrative:  Sandra Hobbs is a 71 y.o. female with a past medical history of primary biliary cirrhosis who is under the care of a gastroenterologist has been experiencing worsening abdominal discomfort and jaundice over the past many weeks.  She was referred to the transplant center at [facility] for evaluation.  She was seen there about a week ago when she underwent upper and lower endoscopies.  Plan is for her to be reevaluated after Thanksgiving.  In the meantime patient's abdominal pain has worsened.  She has experienced worsening distention of her abdomen.  The jaundice is also worsened.  The pain is 4 out of 10 in intensity in the abdomen without any precipitating aggravating or relieving factors.  She denies any fever or chills.  Some nausea but no vomiting.  No diarrhea.  The patient was recently taken off of diuretics and then resumed due to increase in abdominal girth.  The patient feels like she is extremely dry in her mouth.  She was seen at her gastroenterologist office today and was referred to the hospital for further evaluation and management.     Assessment & Plan:   Principal Problem:   Primary biliary cirrhosis (HCC) Active Problems:   Jaundice   Hyperbilirubinemia   Hypercalcemia   Dehydration   Ascites   Thrombocytopenia (HCC)   Acute lower UTI   Palliative care encounter   Hepatic cirrhosis due to primary biliary cholangitis (HCC)   Pain   End-stage primary biliary cirrhosis with abnormal LFTs hyperbilirubinemia and with portal hypertension and ascites -Gastroenterology consulted -Patient underwent paracentesis on 03/10/2017 -Paracentesis yielded 3.8 L of turbid yellow fluid with a cell count showing a total WBC of 7800 with 65% neutrophils -Patient was started on ceftriaxone 2 g every 24 hours then transition to [place] on 03/12/2017 as  cultures growing out Enterococcus faecalis -Rapid response called on 03/12/2017 and patient was transferred to the intensive care unit over concerns of hypotension and increasing confusion, overnight from 11/17-11/18 patient was started on pressors due to hypotension, in a.m. of 03/13/2017 patient was intubated for protection of airway in anticipation of transfer to [facility] ICU -Patient is currently awaiting bed at [facility] ICU -Patient is currently on phenylephrine infusion to help increase blood pressures -Critical care managing -NG tube inserted with dark gastric contents on low intermittent suction - Gastroccult ordered  Moderate hypercalcemia - Corrected calcium is 13.8 at time of admission -Patient currently not taking anything by mouth  Thrombocytopenia - This is a result of her cirrhosis -Monitor serial CBCs given dark gastric contents and NG tube  Urinary tract infection - Urine culture showing greater than 100,000 colonies of E. coli -7 change to Levaquin    DVT prophylaxis: TED hose Code Status: Full code Family Communication: No family bedside Disposition Plan: Patient to be transferred to [facility] ICU when bed becomes available, critical care now managing   Consultants:   [facility]  P CCM/critical care  Procedures:   Paracentesis on 03/10/2017  Intubation on 03/13/2017  Central line placement on 03/13/2017  Antimicrobials:   Ceftriaxone  Levaquin   Subjective: Patient is currently intubated and in the ICU.  She is nonresponsive and sedated  Objective: Vitals:   03/13/17 0600 03/13/17 0615 03/13/17 0734 03/13/17 0800  BP: (!) 105/53   (!) 113/54  Pulse:      Resp: 18 16  18  Temp:    97.7 F (36.5 C)  TempSrc:    Oral  SpO2: 100% 100% 100% 100%  Weight:      Height:        Intake/Output Summary (Last 24 hours) at 03/13/2017 0831 Last data filed at 03/13/2017 0600 Gross per 24 hour  Intake 823.63 ml  Output 925 ml  Net -101.37 ml    Filed Weights   03/10/17 1558  Weight: 63.2 kg (139 lb 5.3 oz)    Examination:  General exam: Sedated Respiratory system: On ventilator, bibasilar rales Cardiovascular system: S1 & S2 heard, no JVD, murmurs, rubs, gallops or clicks.  TED hose in place Gastrointestinal system: Abdomen is distended with a midline umbilical hernia, appreciable fluid wave, nontender soft bowel sounds Central nervous system: Sedated Extremities: Unable to evaluate givin sedation Skin: Jaundice Psychiatry: Unable to evaluate as patient is he did    Data Reviewed: I have personally reviewed following labs and imaging studies  CBC: Recent Labs  Lab 03/10/17 1103 03/11/17 0540 03/12/17 0551 03/13/17 0500  WBC 6.9 15.1* 20.3* 24.4*  NEUTROABS  --  13.0* 17.7*  --   HGB 13.4 12.4 14.7 11.2*  HCT 38.8 36.0 42.6 33.0*  MCV 95.3 96.3 97.3 96.5  PLT 93* 67* 84* 76*   Basic Metabolic Panel: Recent Labs  Lab 03/10/17 1103 03/11/17 0540 03/12/17 0551 03/12/17 1039 03/13/17 0500  NA 136 139 141 140 144  K 3.6 3.9 3.8 4.1 3.1*  CL 103 110 109 109 113*  CO2 20* 20* 21* 21* 20*  GLUCOSE 81 56* 79 86 79  BUN 47* 51* 79* 79* 95*  CREATININE 1.48* 1.62* 1.96* 2.13* 2.29*  CALCIUM 12.0* 11.1* 11.4* 11.4* 11.1*   GFR: Estimated Creatinine Clearance: 20.6 mL/min (A) (by C-G formula based on SCr of 2.29 mg/dL (H)). Liver Function Tests: Recent Labs  Lab 03/10/17 1103 03/11/17 0540 03/12/17 0551 03/12/17 1039 03/13/17 0500  AST 102* 79* 140* 160* 153*  ALT 56* 44 56* 64* 54  ALKPHOS 186* 132* 192* 183* 148*  BILITOT 15.7* 12.7* 17.0* 17.8* 19.0*  PROT 5.9* 4.7* 5.4* 5.3* 5.2*  ALBUMIN 1.9* 1.6* 1.8* 1.7* 2.9*   Recent Labs  Lab 03/10/17 1103  LIPASE 28   Recent Labs  Lab 03/10/17 1103  AMMONIA 39*   Coagulation Profile: Recent Labs  Lab 03/10/17 1103 03/11/17 0540 03/12/17 0551 03/13/17 0500  INR 1.46 2.23 1.99 2.01   Cardiac Enzymes: No results for input(s): CKTOTAL,  CKMB, CKMBINDEX, TROPONINI in the last 168 hours. BNP (last 3 results) No results for input(s): PROBNP in the last 8760 hours. HbA1C: No results for input(s): HGBA1C in the last 72 hours. CBG: No results for input(s): GLUCAP in the last 168 hours. Lipid Profile: No results for input(s): CHOL, HDL, LDLCALC, TRIG, CHOLHDL, LDLDIRECT in the last 72 hours. Thyroid Function Tests: No results for input(s): TSH, T4TOTAL, FREET4, T3FREE, THYROIDAB in the last 72 hours. Anemia Panel: No results for input(s): VITAMINB12, FOLATE, FERRITIN, TIBC, IRON, RETICCTPCT in the last 72 hours. Sepsis Labs: No results for input(s): PROCALCITON, LATICACIDVEN in the last 168 hours.  Recent Results (from the past 240 hour(s))  Urine Culture     Status: Abnormal (Preliminary result)   Collection Time: 03/10/17 10:40 AM  Result Value Ref Range Status   Specimen Description URINE, CLEAN CATCH  Final   Special Requests NONE  Final   Culture (A)  Final    >=100,000 COLONIES/mL ESCHERICHIA COLI >=100,000 COLONIES/mL  ENTEROCOCCUS FAECALIS    Report Status PENDING  Incomplete   Organism ID, Bacteria ESCHERICHIA COLI (A)  Final      Susceptibility   Escherichia coli - MIC*    AMPICILLIN >=32 RESISTANT Resistant     CEFAZOLIN 16 SENSITIVE Sensitive     CEFTRIAXONE <=1 SENSITIVE Sensitive     CIPROFLOXACIN 0.5 SENSITIVE Sensitive     GENTAMICIN <=1 SENSITIVE Sensitive     IMIPENEM <=0.25 SENSITIVE Sensitive     NITROFURANTOIN 64 INTERMEDIATE Intermediate     TRIMETH/SULFA <=20 SENSITIVE Sensitive     AMPICILLIN/SULBACTAM >=32 RESISTANT Resistant     PIP/TAZO 64 INTERMEDIATE Intermediate     Extended ESBL NEGATIVE Sensitive     * >=100,000 COLONIES/mL ESCHERICHIA COLI  Culture, body fluid-bottle     Status: None (Preliminary result)   Collection Time: 03/10/17  2:28 PM  Result Value Ref Range Status   Specimen Description FLUID PERITONEAL ASCITIC  Final   Special Requests BOTTLES DRAWN AEROBIC AND ANAEROBIC   Final   Culture   Final    CULTURE REINCUBATED FOR BETTER GROWTH Performed at [facility], [address], [place], [place] [address]    Report Status PENDING  Incomplete  Gram stain     Status: None   Collection Time: 03/10/17  2:28 PM  Result Value Ref Range Status   Specimen Description FLUID PERITONEAL ASCITIC  Final   Special Requests NONE  Final   Gram Stain   Final    ABUNDANT WBC PRESENT,BOTH PMN AND MONONUCLEAR ABUNDANT GRAM NEGATIVE RODS Performed at [facility], [address] [place], [place] [address]    Report Status 03/10/2017 FINAL  Final         Radiology Studies: Dg Chest Port 1 View  Result Date: 03/13/2017 CLINICAL DATA:  Endotracheal tube and central line placement. Orogastric tube placement. EXAM: PORTABLE CHEST 1 VIEW COMPARISON:  None. FINDINGS: The patient's endotracheal tube is seen ending 1-2 cm above the carina. This could be retracted 2 cm. The patient's enteric tube is seen ending overlying the body of the stomach. The left IJ line is noted ending about the distal SVC. Retrocardiac airspace opacification raises concern for pneumonia. Mild right basilar airspace opacity is also noted. A small left pleural effusion is noted. No pneumothorax is seen. The cardiomediastinal silhouette is normal in size. No acute osseous abnormalities are identified. IMPRESSION: 1. Endotracheal tube seen ending 1-2 cm above the carina. This could be retracted 2 cm, as deemed clinically appropriate. 2. Enteric tube noted ending overlying the body of the stomach. 3. Left IJ line noted ending about the distal SVC. 4. Retrocardiac airspace opacification raises concern for pneumonia. Mild right basilar airspace opacity also noted. Small left pleural effusion seen. Electronically Signed   By: Garald Balding M.D.   On: 03/13/2017 04:37        Scheduled Meds: . chlorhexidine gluconate (MEDLINE KIT)  15 mL Mouth Rinse BID  . feeding supplement (ENSURE ENLIVE)  237 mL  Oral BID BM  . lactulose  30 g Oral TID  . mouth rinse  15 mL Mouth Rinse QID  . pantoprazole (PROTONIX) IV  40 mg Intravenous Daily  . phytonadione  10 mg Subcutaneous Daily  . prednisoLONE acetate  1 drop Right Eye BID  . sodium chloride  1 drop Left Eye BID  . timolol  1 drop Right Eye BID  . ursodiol  300 mg Oral BID   Continuous Infusions: . phenylephrine    .  fentaNYL infusion INTRAVENOUS 75 mcg/hr (03/13/17 0600)  . levofloxacin (LEVAQUIN) IV Stopped (03/12/17 2241)  . phenylephrine (NEO-SYNEPHRINE) Adult infusion       LOS: 3 days    Critical Care time: 35 minutes    Loretha Stapler, MD [facility] Pager [phone]  If 7PM-7AM, please contact night-coverage [email or web address] Password [email or web address] 03/13/2017, 8:31 AM

## 2017-03-13 NOTE — Progress Notes (Signed)
Called to room to assist with intubation.  Pt. Intubated on first attempt by Critical Care MD with #8 ett, secured 23 at lip, pending CXR.  Positive color changed on ETCo2 with bilateral breath sound heard by RN and this RT.

## 2017-03-13 NOTE — Plan of Care (Signed)
  Progressing Education: Knowledge of General Education information will improve 03/13/2017 1542 - Progressing by Staci Righter, RN

## 2017-03-13 NOTE — Progress Notes (Signed)
Darrin Nipper RN requested phenylephrine infusion be switched to quad strength. I called her as a safety measure to remind her to change the concentration in the pump.

## 2017-03-13 NOTE — Progress Notes (Signed)
RN attempted to place flexiseal . Was unsuccessful. 2 nd attempt by charge nurse was unsuccessful too.

## 2017-03-13 NOTE — Progress Notes (Signed)
Transport team here for patient.

## 2017-03-14 DIAGNOSIS — J9600 Acute respiratory failure, unspecified whether with hypoxia or hypercapnia: Secondary | ICD-10-CM | POA: Diagnosis not present

## 2017-03-14 DIAGNOSIS — A0472 Enterocolitis due to Clostridium difficile, not specified as recurrent: Secondary | ICD-10-CM | POA: Diagnosis not present

## 2017-03-14 DIAGNOSIS — J811 Chronic pulmonary edema: Secondary | ICD-10-CM | POA: Diagnosis not present

## 2017-03-14 DIAGNOSIS — A419 Sepsis, unspecified organism: Secondary | ICD-10-CM | POA: Diagnosis not present

## 2017-03-14 DIAGNOSIS — N179 Acute kidney failure, unspecified: Secondary | ICD-10-CM | POA: Diagnosis not present

## 2017-03-14 DIAGNOSIS — B9620 Unspecified Escherichia coli [E. coli] as the cause of diseases classified elsewhere: Secondary | ICD-10-CM | POA: Diagnosis not present

## 2017-03-14 DIAGNOSIS — J90 Pleural effusion, not elsewhere classified: Secondary | ICD-10-CM | POA: Diagnosis not present

## 2017-03-14 DIAGNOSIS — K7290 Hepatic failure, unspecified without coma: Secondary | ICD-10-CM | POA: Diagnosis not present

## 2017-03-14 DIAGNOSIS — E870 Hyperosmolality and hypernatremia: Secondary | ICD-10-CM | POA: Diagnosis not present

## 2017-03-14 DIAGNOSIS — R109 Unspecified abdominal pain: Secondary | ICD-10-CM | POA: Diagnosis not present

## 2017-03-14 DIAGNOSIS — K6389 Other specified diseases of intestine: Secondary | ICD-10-CM | POA: Diagnosis not present

## 2017-03-14 DIAGNOSIS — K560 Paralytic ileus: Secondary | ICD-10-CM | POA: Diagnosis not present

## 2017-03-14 DIAGNOSIS — K5939 Other megacolon: Secondary | ICD-10-CM | POA: Diagnosis not present

## 2017-03-14 DIAGNOSIS — K766 Portal hypertension: Secondary | ICD-10-CM | POA: Diagnosis not present

## 2017-03-14 DIAGNOSIS — N170 Acute kidney failure with tubular necrosis: Secondary | ICD-10-CM | POA: Diagnosis not present

## 2017-03-14 DIAGNOSIS — D72829 Elevated white blood cell count, unspecified: Secondary | ICD-10-CM | POA: Diagnosis not present

## 2017-03-14 DIAGNOSIS — K567 Ileus, unspecified: Secondary | ICD-10-CM | POA: Diagnosis not present

## 2017-03-14 DIAGNOSIS — K743 Primary biliary cirrhosis: Secondary | ICD-10-CM | POA: Diagnosis not present

## 2017-03-14 DIAGNOSIS — K652 Spontaneous bacterial peritonitis: Secondary | ICD-10-CM | POA: Diagnosis not present

## 2017-03-14 DIAGNOSIS — R140 Abdominal distension (gaseous): Secondary | ICD-10-CM | POA: Diagnosis not present

## 2017-03-14 DIAGNOSIS — K7200 Acute and subacute hepatic failure without coma: Secondary | ICD-10-CM | POA: Diagnosis not present

## 2017-03-14 DIAGNOSIS — B952 Enterococcus as the cause of diseases classified elsewhere: Secondary | ICD-10-CM | POA: Diagnosis not present

## 2017-03-14 DIAGNOSIS — R6521 Severe sepsis with septic shock: Secondary | ICD-10-CM | POA: Diagnosis not present

## 2017-03-14 DIAGNOSIS — R188 Other ascites: Secondary | ICD-10-CM | POA: Diagnosis not present

## 2017-03-14 DIAGNOSIS — K922 Gastrointestinal hemorrhage, unspecified: Secondary | ICD-10-CM | POA: Diagnosis not present

## 2017-03-14 DIAGNOSIS — I4891 Unspecified atrial fibrillation: Secondary | ICD-10-CM | POA: Diagnosis not present

## 2017-03-14 DIAGNOSIS — N390 Urinary tract infection, site not specified: Secondary | ICD-10-CM | POA: Diagnosis not present

## 2017-03-14 DIAGNOSIS — B49 Unspecified mycosis: Secondary | ICD-10-CM | POA: Diagnosis not present

## 2017-03-14 DIAGNOSIS — K623 Rectal prolapse: Secondary | ICD-10-CM | POA: Diagnosis not present

## 2017-03-14 DIAGNOSIS — R4182 Altered mental status, unspecified: Secondary | ICD-10-CM | POA: Diagnosis not present

## 2017-03-14 DIAGNOSIS — K7460 Unspecified cirrhosis of liver: Secondary | ICD-10-CM | POA: Diagnosis not present

## 2017-03-14 DIAGNOSIS — E874 Mixed disorder of acid-base balance: Secondary | ICD-10-CM | POA: Diagnosis not present

## 2017-03-14 DIAGNOSIS — Z66 Do not resuscitate: Secondary | ICD-10-CM | POA: Diagnosis not present

## 2017-03-14 DIAGNOSIS — G9341 Metabolic encephalopathy: Secondary | ICD-10-CM | POA: Diagnosis not present

## 2017-03-14 DIAGNOSIS — G9340 Encephalopathy, unspecified: Secondary | ICD-10-CM | POA: Diagnosis not present

## 2017-03-14 DIAGNOSIS — N819 Female genital prolapse, unspecified: Secondary | ICD-10-CM | POA: Diagnosis not present

## 2017-03-14 DIAGNOSIS — D689 Coagulation defect, unspecified: Secondary | ICD-10-CM | POA: Diagnosis not present

## 2017-03-14 DIAGNOSIS — J9811 Atelectasis: Secondary | ICD-10-CM | POA: Diagnosis not present

## 2017-03-14 DIAGNOSIS — G92 Toxic encephalopathy: Secondary | ICD-10-CM | POA: Diagnosis not present

## 2017-03-14 DIAGNOSIS — R918 Other nonspecific abnormal finding of lung field: Secondary | ICD-10-CM | POA: Diagnosis not present

## 2017-03-14 DIAGNOSIS — K767 Hepatorenal syndrome: Secondary | ICD-10-CM | POA: Diagnosis not present

## 2017-03-14 LAB — COMPREHENSIVE METABOLIC PANEL
ALK PHOS: 148 U/L — AB (ref 38–126)
ALT: 54 U/L (ref 14–54)
ANION GAP: 11 (ref 5–15)
AST: 153 U/L — ABNORMAL HIGH (ref 15–41)
Albumin: 2.9 g/dL — ABNORMAL LOW (ref 3.5–5.0)
BUN: 95 mg/dL — ABNORMAL HIGH (ref 6–20)
CHLORIDE: 113 mmol/L — AB (ref 101–111)
CO2: 20 mmol/L — ABNORMAL LOW (ref 22–32)
Calcium: 11.1 mg/dL — ABNORMAL HIGH (ref 8.9–10.3)
Creatinine, Ser: 2.29 mg/dL — ABNORMAL HIGH (ref 0.44–1.00)
GFR, EST AFRICAN AMERICAN: 24 mL/min — AB (ref >60)
GFR, EST NON AFRICAN AMERICAN: 20 mL/min — AB (ref >60)
Glucose, Bld: 79 mg/dL (ref 65–99)
Potassium: 3.1 mmol/L — ABNORMAL LOW (ref 3.5–5.1)
SODIUM: 144 mmol/L (ref 135–145)
TOTAL PROTEIN: 5.2 g/dL — AB (ref 6.5–8.1)
Total Bilirubin: 19 mg/dL (ref 0.3–1.2)

## 2017-03-14 LAB — BLOOD GAS, ARTERIAL
Acid-base deficit: 4.7 mmol/L — ABNORMAL HIGH (ref 0.0–2.0)
BICARBONATE: 18.6 mmol/L — AB (ref 20.0–28.0)
Drawn by: 51425
O2 Content: 4 L/min
O2 Saturation: 91.7 %
PATIENT TEMPERATURE: 98.6
PCO2 ART: 30.1 mmHg — AB (ref 32.0–48.0)
PH ART: 7.409 (ref 7.350–7.450)
PO2 ART: 67.6 mmHg — AB (ref 83.0–108.0)

## 2017-03-14 LAB — CULTURE, BODY FLUID-BOTTLE

## 2017-03-14 LAB — SODIUM, URINE, RANDOM: Sodium, Ur: 11 mmol/L

## 2017-03-14 LAB — CULTURE, BODY FLUID W GRAM STAIN -BOTTLE

## 2017-03-14 LAB — CREATININE, URINE, RANDOM: CREATININE, URINE: 112.49 mg/dL

## 2017-03-17 LAB — CULTURE, BLOOD (ROUTINE X 2)
Culture: NO GROWTH
Culture: NO GROWTH
SPECIAL REQUESTS: ADEQUATE
SPECIAL REQUESTS: ADEQUATE

## 2017-04-26 DEATH — deceased

## 2017-10-06 ENCOUNTER — Encounter: Payer: PPO | Admitting: Gynecology

## 2018-01-25 IMAGING — DX DG SHOULDER 2+V*R*
3 series · 3 of 3 positions shown · non-contrast
Comparison: None.

CLINICAL DATA: Pain in the right shoulder

EXAM:
RIGHT SHOULDER - 2+ VIEW

[shoulder grashey]
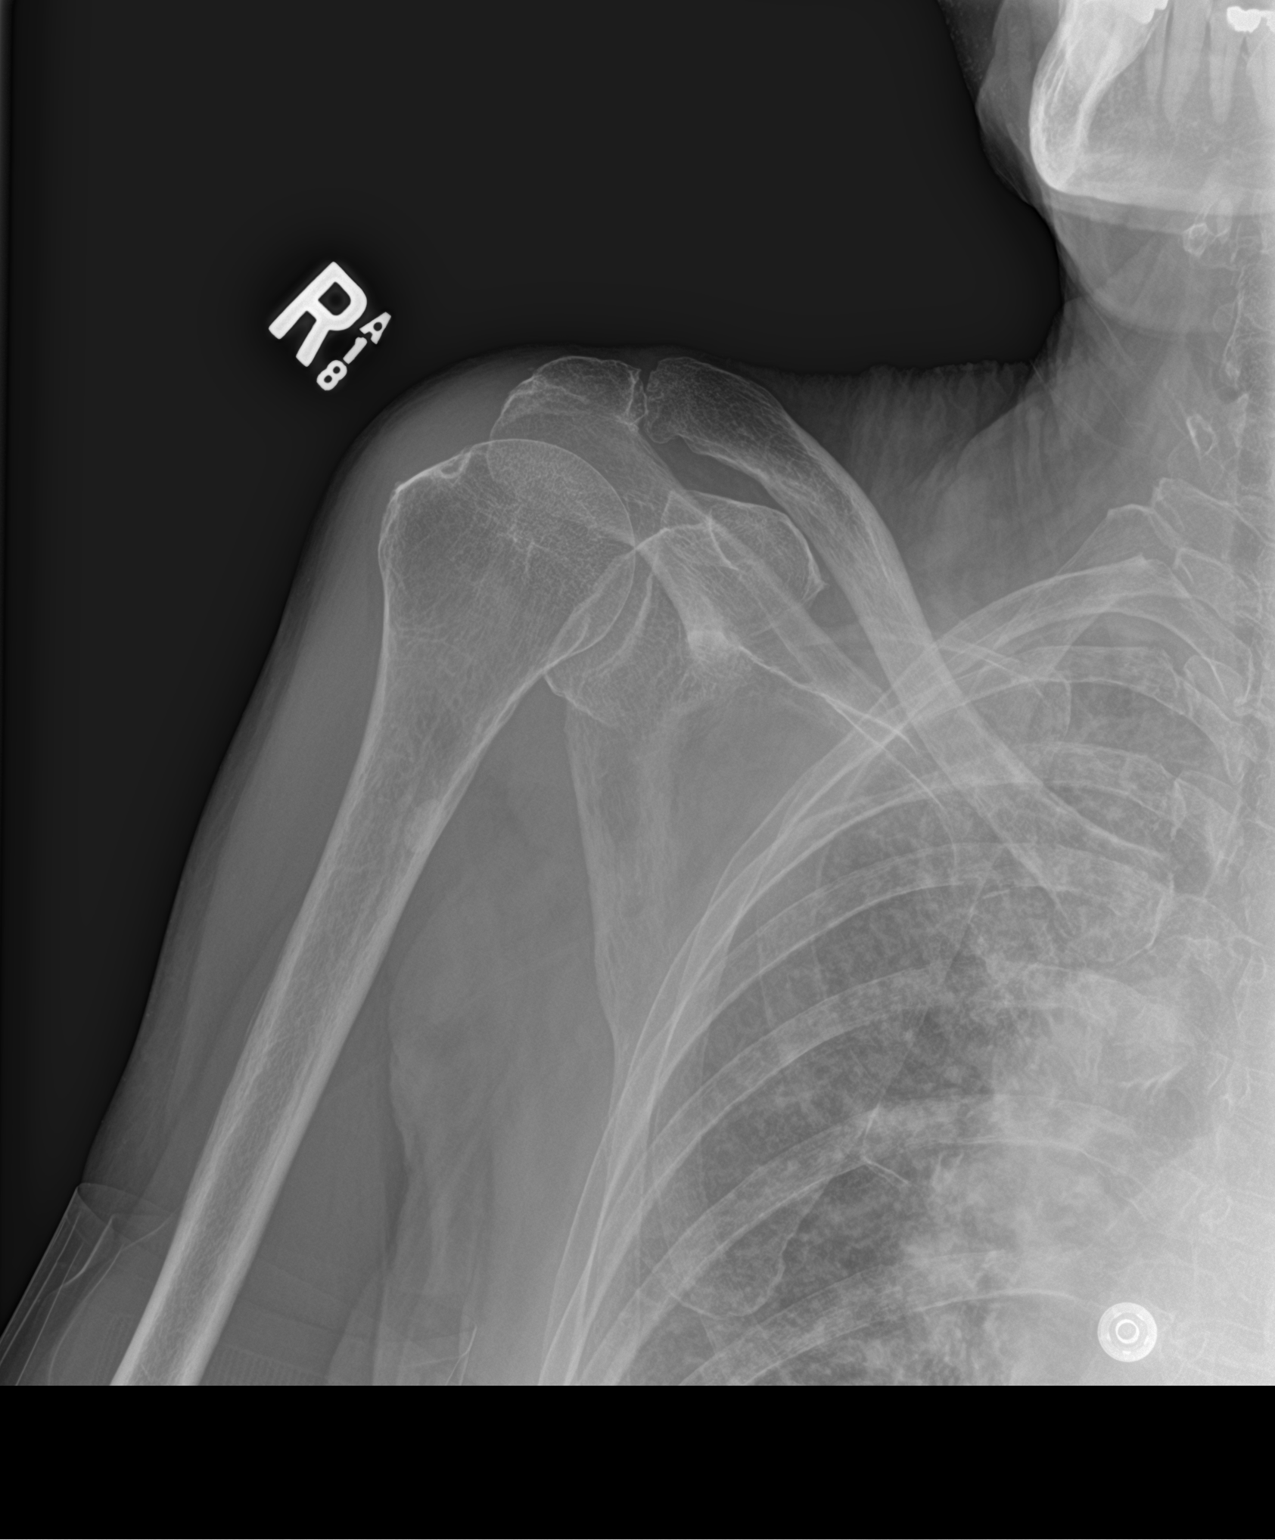

[shoulder y view]
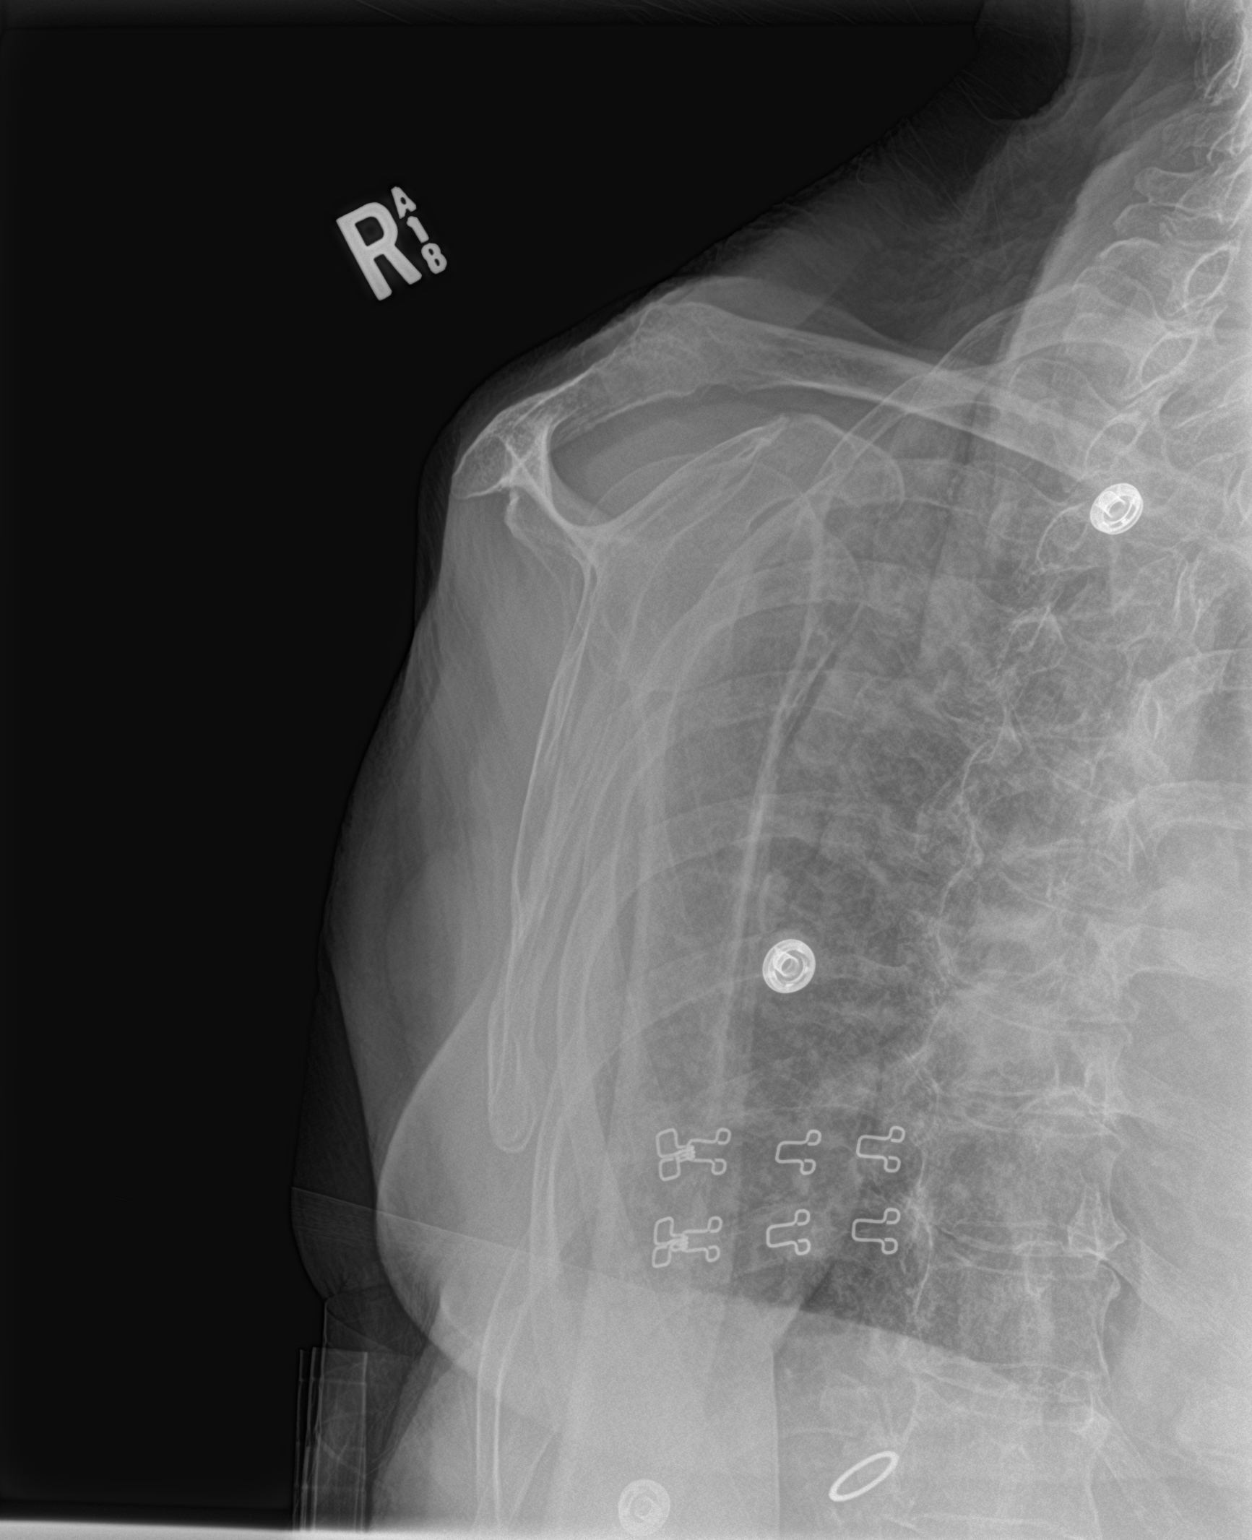

[shoulder ap neutral]
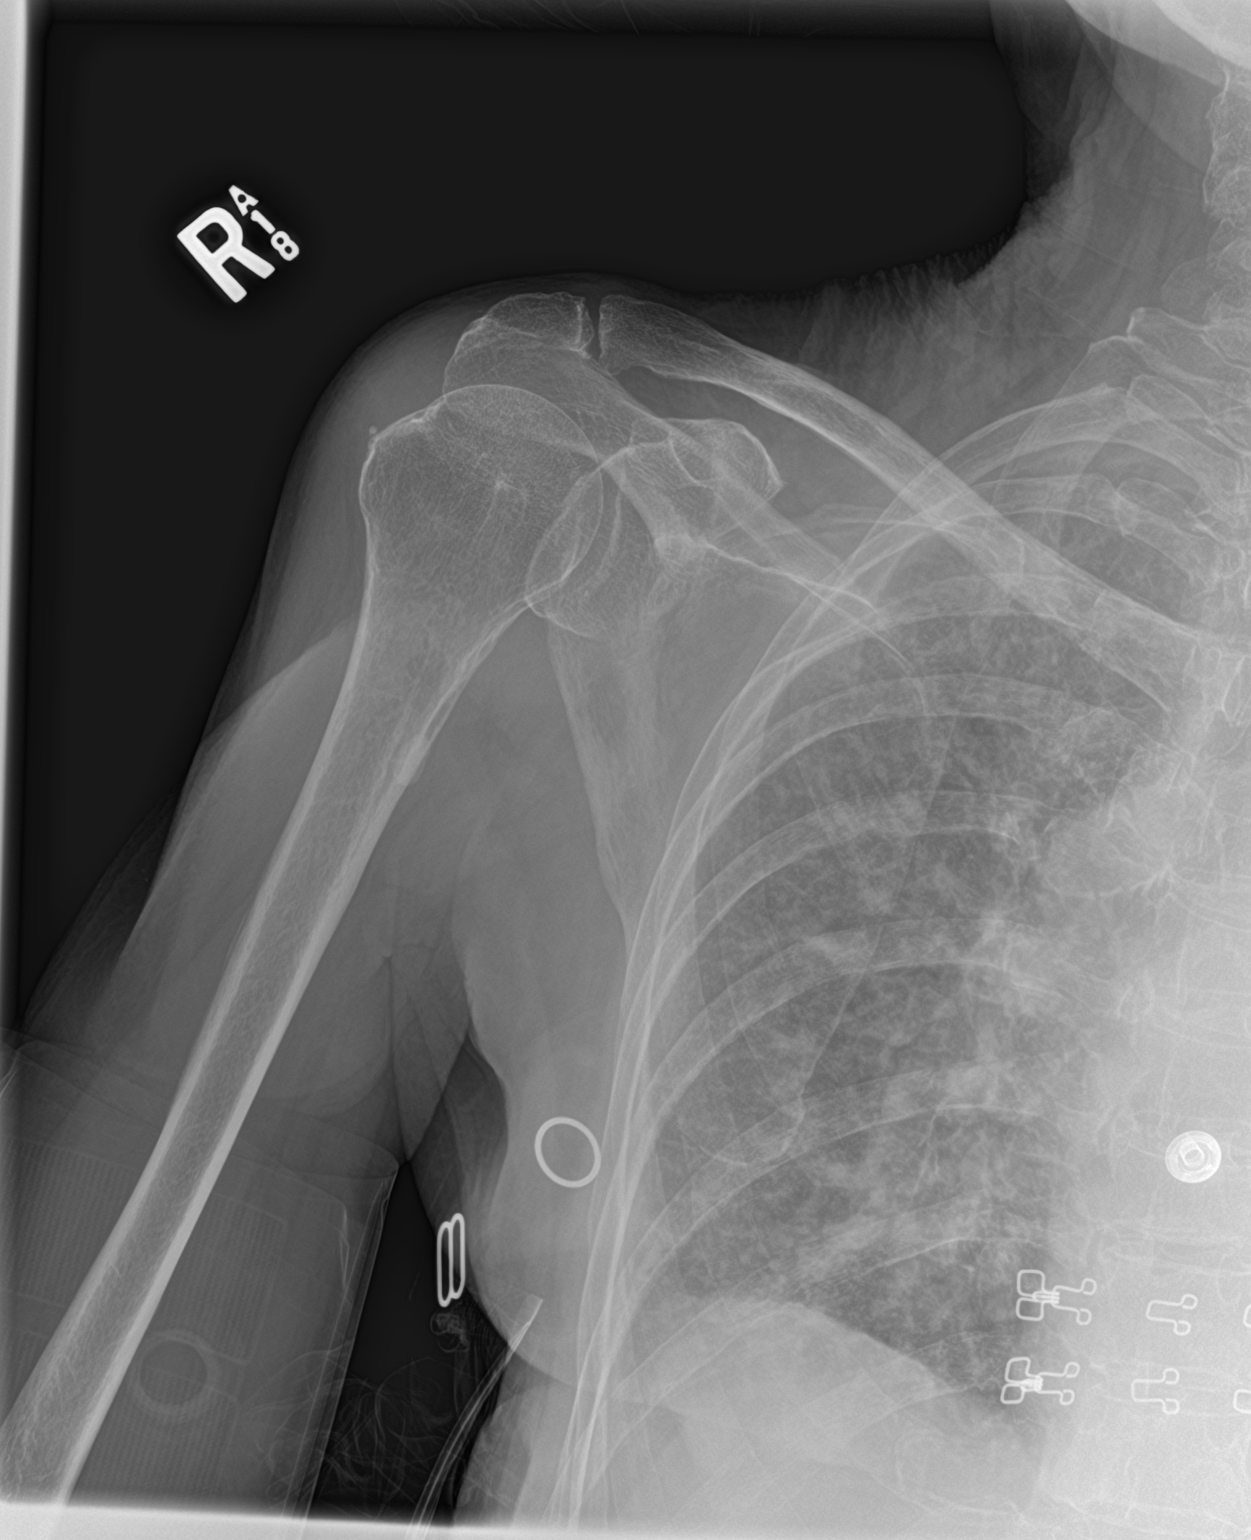

[3 of 3 positions shown; findings below may reference images not displayed]

FINDINGS: No fracture or malalignment. Mild AC joint degenerative change. Tiny
calcification, lateral aspect of humeral head suggesting mild
tendinitis.
IMPRESSION: 1. No acute osseous abnormality
2. Minimal AC joint degenerative change
3. Small amount of calcific tendinitis
